# Patient Record
Sex: Female | Born: 1955
Health system: Southern US, Community
[De-identification: ages and names within clinical notes are randomized; demographics above are authoritative.]

## PROBLEM LIST (undated history)

## (undated) DIAGNOSIS — M0230 Reiter's disease, unspecified site: Secondary | ICD-10-CM

## (undated) HISTORY — PX: HUMERUS SURGERY: SHX672

## (undated) HISTORY — DX: Reiter's disease, unspecified site: M02.30

## (undated) HISTORY — PX: FRACTURE SURGERY: SHX138

---

## 1978-07-06 HISTORY — PX: BREAST EXCISIONAL BIOPSY: SUR124

## 2011-03-11 ENCOUNTER — Other Ambulatory Visit: Payer: Self-pay | Admitting: Obstetrics and Gynecology

## 2011-03-11 ENCOUNTER — Other Ambulatory Visit (HOSPITAL_COMMUNITY)
Admission: RE | Admit: 2011-03-11 | Discharge: 2011-03-11 | Disposition: A | Discharge status: Home / Self Care | Payer: BC Managed Care – PPO | Source: Ambulatory Visit | Attending: Obstetrics and Gynecology | Admitting: Obstetrics and Gynecology

## 2011-03-11 DIAGNOSIS — Z01419 Encounter for gynecological examination (general) (routine) without abnormal findings: Secondary | ICD-10-CM | POA: Insufficient documentation

## 2011-03-11 DIAGNOSIS — N6315 Unspecified lump in the right breast, overlapping quadrants: Secondary | ICD-10-CM

## 2011-03-18 ENCOUNTER — Other Ambulatory Visit: Payer: Self-pay

## 2011-03-25 ENCOUNTER — Other Ambulatory Visit: Payer: Self-pay

## 2013-07-11 ENCOUNTER — Other Ambulatory Visit: Payer: Self-pay | Admitting: Obstetrics and Gynecology

## 2013-07-11 DIAGNOSIS — Z1231 Encounter for screening mammogram for malignant neoplasm of breast: Secondary | ICD-10-CM

## 2013-08-02 ENCOUNTER — Ambulatory Visit
Admission: RE | Admit: 2013-08-02 | Discharge: 2013-08-02 | Disposition: A | Discharge status: Home / Self Care | Payer: Self-pay | Source: Ambulatory Visit | Attending: Obstetrics and Gynecology | Admitting: Obstetrics and Gynecology

## 2013-08-02 DIAGNOSIS — Z1231 Encounter for screening mammogram for malignant neoplasm of breast: Secondary | ICD-10-CM

## 2014-09-24 ENCOUNTER — Other Ambulatory Visit: Payer: Self-pay

## 2014-09-24 DIAGNOSIS — Z1231 Encounter for screening mammogram for malignant neoplasm of breast: Secondary | ICD-10-CM

## 2014-09-27 ENCOUNTER — Ambulatory Visit
Admission: RE | Admit: 2014-09-27 | Discharge: 2014-09-27 | Disposition: A | Discharge status: Home / Self Care | Payer: BLUE CROSS/BLUE SHIELD | Source: Ambulatory Visit

## 2014-09-27 DIAGNOSIS — Z1231 Encounter for screening mammogram for malignant neoplasm of breast: Secondary | ICD-10-CM

## 2015-03-19 ENCOUNTER — Other Ambulatory Visit: Payer: Self-pay | Admitting: Rheumatology

## 2015-03-19 ENCOUNTER — Ambulatory Visit
Admission: RE | Admit: 2015-03-19 | Discharge: 2015-03-19 | Disposition: A | Discharge status: Home / Self Care | Payer: BLUE CROSS/BLUE SHIELD | Source: Ambulatory Visit | Attending: Rheumatology | Admitting: Rheumatology

## 2015-03-19 DIAGNOSIS — Z9225 Personal history of immunosupression therapy: Secondary | ICD-10-CM

## 2015-08-26 ENCOUNTER — Other Ambulatory Visit: Payer: Self-pay

## 2015-08-26 DIAGNOSIS — Z1231 Encounter for screening mammogram for malignant neoplasm of breast: Secondary | ICD-10-CM

## 2015-09-30 ENCOUNTER — Ambulatory Visit
Admission: RE | Admit: 2015-09-30 | Discharge: 2015-09-30 | Disposition: A | Discharge status: Home / Self Care | Payer: BLUE CROSS/BLUE SHIELD | Source: Ambulatory Visit

## 2015-09-30 DIAGNOSIS — Z1231 Encounter for screening mammogram for malignant neoplasm of breast: Secondary | ICD-10-CM

## 2016-05-12 ENCOUNTER — Other Ambulatory Visit: Payer: Self-pay | Admitting: Internal Medicine

## 2016-05-12 ENCOUNTER — Ambulatory Visit
Admission: RE | Admit: 2016-05-12 | Discharge: 2016-05-12 | Disposition: A | Discharge status: Home / Self Care | Payer: BLUE CROSS/BLUE SHIELD | Source: Ambulatory Visit | Attending: Internal Medicine | Admitting: Internal Medicine

## 2016-05-12 DIAGNOSIS — R0781 Pleurodynia: Secondary | ICD-10-CM

## 2016-06-08 ENCOUNTER — Other Ambulatory Visit: Payer: Self-pay | Admitting: Obstetrics and Gynecology

## 2016-06-08 ENCOUNTER — Other Ambulatory Visit (HOSPITAL_COMMUNITY)
Admission: RE | Admit: 2016-06-08 | Discharge: 2016-06-08 | Disposition: A | Discharge status: Home / Self Care | Payer: BLUE CROSS/BLUE SHIELD | Source: Ambulatory Visit | Attending: Obstetrics and Gynecology | Admitting: Obstetrics and Gynecology

## 2016-06-08 DIAGNOSIS — Z01419 Encounter for gynecological examination (general) (routine) without abnormal findings: Secondary | ICD-10-CM | POA: Insufficient documentation

## 2016-06-08 DIAGNOSIS — Z1151 Encounter for screening for human papillomavirus (HPV): Secondary | ICD-10-CM | POA: Insufficient documentation

## 2016-06-10 ENCOUNTER — Other Ambulatory Visit: Payer: Self-pay | Admitting: Gastroenterology

## 2016-06-10 LAB — CYTOLOGY - PAP
Diagnosis: NEGATIVE
HPV (WINDOPATH): NOT DETECTED

## 2016-08-13 ENCOUNTER — Other Ambulatory Visit: Payer: Self-pay | Admitting: Nurse Practitioner

## 2016-08-13 DIAGNOSIS — I7300 Raynaud's syndrome without gangrene: Secondary | ICD-10-CM

## 2016-08-25 ENCOUNTER — Ambulatory Visit
Admission: RE | Admit: 2016-08-25 | Discharge: 2016-08-25 | Disposition: A | Discharge status: Home / Self Care | Payer: BLUE CROSS/BLUE SHIELD | Source: Ambulatory Visit | Attending: Nurse Practitioner | Admitting: Nurse Practitioner

## 2016-08-25 DIAGNOSIS — I7300 Raynaud's syndrome without gangrene: Secondary | ICD-10-CM

## 2016-08-31 ENCOUNTER — Encounter (HOSPITAL_COMMUNITY)
Admission: RE | Disposition: A | Discharge status: Home / Self Care | Payer: Self-pay | Source: Ambulatory Visit | Attending: Gastroenterology

## 2016-08-31 ENCOUNTER — Ambulatory Visit (HOSPITAL_COMMUNITY)
Admission: RE | Admit: 2016-08-31 | Discharge: 2016-08-31 | Disposition: A | Discharge status: Home / Self Care | Payer: BLUE CROSS/BLUE SHIELD | Source: Ambulatory Visit | Attending: Gastroenterology | Admitting: Gastroenterology

## 2016-08-31 ENCOUNTER — Ambulatory Visit (HOSPITAL_COMMUNITY): Payer: BLUE CROSS/BLUE SHIELD | Admitting: Anesthesiology

## 2016-08-31 ENCOUNTER — Encounter (HOSPITAL_COMMUNITY): Payer: Self-pay | Admitting: *Deleted

## 2016-08-31 DIAGNOSIS — Z1211 Encounter for screening for malignant neoplasm of colon: Secondary | ICD-10-CM | POA: Insufficient documentation

## 2016-08-31 DIAGNOSIS — H209 Unspecified iridocyclitis: Secondary | ICD-10-CM | POA: Insufficient documentation

## 2016-08-31 DIAGNOSIS — F419 Anxiety disorder, unspecified: Secondary | ICD-10-CM | POA: Insufficient documentation

## 2016-08-31 DIAGNOSIS — F329 Major depressive disorder, single episode, unspecified: Secondary | ICD-10-CM | POA: Diagnosis not present

## 2016-08-31 DIAGNOSIS — I7300 Raynaud's syndrome without gangrene: Secondary | ICD-10-CM | POA: Diagnosis not present

## 2016-08-31 HISTORY — PX: COLONOSCOPY WITH PROPOFOL: SHX5780

## 2016-08-31 SURGERY — COLONOSCOPY WITH PROPOFOL
Anesthesia: Monitor Anesthesia Care

## 2016-08-31 MED ORDER — SODIUM CHLORIDE 0.9 % IV SOLN: INTRAVENOUS | Status: DC

## 2016-08-31 MED ORDER — LACTATED RINGERS IV SOLN
INTRAVENOUS | Status: DC
  Administered 2016-08-31: 08:00:00 via INTRAVENOUS

## 2016-08-31 MED ORDER — PROPOFOL 10 MG/ML IV BOLUS
INTRAVENOUS | Status: AC
Start: 2016-08-31 — End: 2016-08-31
  Filled 2016-08-31: qty 40

## 2016-08-31 MED ORDER — GLYCOPYRROLATE 0.2 MG/ML IJ SOLN
INTRAMUSCULAR | Status: DC | PRN
  Administered 2016-08-31: 0.2 mg via INTRAVENOUS
  Administered 2016-08-31: 0.1 mg via INTRAVENOUS

## 2016-08-31 MED ORDER — PROPOFOL 10 MG/ML IV BOLUS
INTRAVENOUS | Status: DC | PRN
  Administered 2016-08-31 (×3): 20 mg via INTRAVENOUS
  Administered 2016-08-31: 80 mg via INTRAVENOUS
  Administered 2016-08-31 (×5): 20 mg via INTRAVENOUS

## 2016-08-31 MED ORDER — LIDOCAINE HCL 1 % IJ SOLN
INTRAMUSCULAR | Status: DC | PRN
  Administered 2016-08-31: 20 mg via INTRADERMAL

## 2016-08-31 SURGICAL SUPPLY — 21 items

## 2016-08-31 NOTE — Op Note (Signed)
[facility] Patient Name: Alexis Payne Procedure Date: 08/31/2016 MRN: [id number] Attending MD: Garlan Fair , MD Date of Birth: 1956/06/22 CSN: [id number] Age: 61 Admit Type: Outpatient Procedure:                Colonoscopy Indications:              Screening for colorectal malignant neoplasm Providers:                Garlan Fair, MD, Cleda Daub, RN, William Dalton, Technician Referring MD:              Medicines:                Propofol per Anesthesia Complications:            No immediate complications. Estimated Blood Loss:     Estimated blood loss: none. Procedure:                Pre-Anesthesia Assessment:                           - Prior to the procedure, a History and Physical                            was performed, and patient medications and                            allergies were reviewed. The patient's tolerance of                            previous anesthesia was also reviewed. The risks                            and benefits of the procedure and the sedation                            options and risks were discussed with the patient.                            All questions were answered, and informed consent                            was obtained. Prior Anticoagulants: The patient has                            taken ibuprofen, last dose was 1 day prior to                            procedure. ASA Grade Assessment: II - A patient                            with mild systemic disease. After reviewing the  risks and benefits, the patient was deemed in                            satisfactory condition to undergo the procedure.                           After obtaining informed consent, the colonoscope                            was passed under direct vision. Throughout the                            procedure, the patient's blood pressure, pulse, and   oxygen saturations were monitored continuously. The                            [id number]) scope was introduced through                            the anus and advanced to the the cecum, identified                            by appendiceal orifice and ileocecal valve. The                            colonoscopy was performed without difficulty. The                            patient tolerated the procedure well. The quality                            of the bowel preparation was good. The ileocecal                            valve, the appendiceal orifice and the rectum were                            photographed. Findings:      The perianal and digital rectal examinations were normal.      The entire examined colon appeared normal. Impression:               - The entire examined colon is normal.                           - No specimens collected. Moderate Sedation:      N/A- Per Anesthesia Care Recommendation:           - Patient has a contact number available for                            emergencies. The signs and symptoms of potential                            delayed complications were discussed with the  patient. Return to normal activities tomorrow.                            Written discharge instructions were provided to the                            patient.                           - Repeat colonoscopy in 10 years for screening                            purposes.                           - Resume previous diet.                           - Continue present medications. Procedure Code(s):        --- Professional ---                           [id number], Colorectal cancer screening; colonoscopy on                            individual not meeting criteria for high risk Diagnosis Code(s):        --- Professional ---                           Z12.11, Encounter for screening for malignant                            neoplasm of colon CPT  copyright 2016 American Medical Association. All rights reserved. The codes documented in this report are preliminary and upon coder review may  be revised to meet current compliance requirements. Earle Gell, MD Garlan Fair, MD 08/31/2016 9:44:51 AM This report has been signed electronically. Number of Addenda: 0

## 2016-08-31 NOTE — Anesthesia Preprocedure Evaluation (Signed)
Anesthesia Evaluation  Patient identified by MRN, date of birth, ID band Patient awake    Reviewed: Allergy & Precautions, NPO status , Patient's Chart, lab work & pertinent test results  Airway Mallampati: II  TM Distance: >3 FB Neck ROM: Full    Dental no notable dental hx.    Pulmonary neg pulmonary ROS,    Pulmonary exam normal breath sounds clear to auscultation       Cardiovascular negative cardio ROS Normal cardiovascular exam Rhythm:Regular Rate:Normal     Neuro/Psych negative neurological ROS  negative psych ROS   GI/Hepatic negative GI ROS, Neg liver ROS,   Endo/Other  negative endocrine ROS  Renal/GU negative Renal ROS  negative genitourinary   Musculoskeletal negative musculoskeletal ROS (+)   Abdominal   Peds negative pediatric ROS (+)  Hematology negative hematology ROS (+)   Anesthesia Other Findings   Reproductive/Obstetrics negative OB ROS                             Anesthesia Physical Anesthesia Plan  ASA: II  Anesthesia Plan: MAC   Post-op Pain Management:    Induction: Intravenous  Airway Management Planned: Nasal Cannula  Additional Equipment:   Intra-op Plan:   Post-operative Plan:   Informed Consent: I have reviewed the patients History and Physical, chart, labs and discussed the procedure including the risks, benefits and alternatives for the proposed anesthesia with the patient or authorized representative who has indicated his/her understanding and acceptance.   Dental advisory given  Plan Discussed with: CRNA and Surgeon  Anesthesia Plan Comments:         Anesthesia Quick Evaluation  

## 2016-08-31 NOTE — Discharge Instructions (Signed)

## 2016-08-31 NOTE — H&P (Signed)
Procedure: Screening colonoscopy. Baseline screening colonoscopy was performed in [place], [place] 10 years ago  History: The patient is a 61 year old female born Oct 21, 1955. She is scheduled to undergo a repeat screening colonoscopy today.  Medication allergies: Wellbutrin caused seizure  Past medical history: Shoulder surgery. Hip debridement surgery without prosthesis. Generalized anxiety disorder. Spondyloarthropathy. Iritis and uveitis. Raynaud's phenomenon. Depression.  Exam: The patient is alert and lying comfortably on the endoscopy stretcher. Abdomen is soft and nontender to palpation. Cardiac exam reveals a regular rhythm. Lungs are clear to auscultation.  Plan: Proceed with screening colonoscopy

## 2016-08-31 NOTE — Anesthesia Postprocedure Evaluation (Addendum)
Anesthesia Post Note  Patient: Alexis Payne  Procedure(s) Performed: Procedure(s) (LRB): COLONOSCOPY WITH PROPOFOL (N/A)  Patient location during evaluation: PACU Anesthesia Type: MAC Level of consciousness: awake and alert Pain management: pain level controlled Vital Signs Assessment: post-procedure vital signs reviewed and stable Respiratory status: spontaneous breathing, nonlabored ventilation, respiratory function stable and patient connected to nasal cannula oxygen Cardiovascular status: stable and blood pressure returned to baseline Anesthetic complications: no       Last Vitals:  Vitals:   08/31/16 1000 08/31/16 1005  BP: 111/71   Pulse:  79  Resp: 19 18  Temp:      Last Pain:  Vitals:   08/31/16 0946  TempSrc: Oral                 Karam Dunson S

## 2016-08-31 NOTE — Transfer of Care (Signed)
Immediate Anesthesia Transfer of Care Note  Patient: Alexis Payne  Procedure(s) Performed: Procedure(s): COLONOSCOPY WITH PROPOFOL (N/A)  Patient Location: PACU and Endoscopy Unit  Anesthesia Type:MAC  Level of Consciousness: awake, alert  and oriented  Airway & Oxygen Therapy: Patient Spontanous Breathing and Patient connected to nasal cannula oxygen  Post-op Assessment: Report given to RN and Post -op Vital signs reviewed and stable  Post vital signs: Reviewed and stable  Last Vitals:  Vitals:   08/31/16 0811  BP: 95/66  Resp: 12  Temp: 36.7 C    Last Pain:  Vitals:   08/31/16 0811  TempSrc: Oral         Complications: No apparent anesthesia complications

## 2016-09-02 ENCOUNTER — Encounter (HOSPITAL_COMMUNITY): Payer: Self-pay | Admitting: Gastroenterology

## 2016-11-04 ENCOUNTER — Other Ambulatory Visit: Payer: Self-pay | Admitting: Internal Medicine

## 2016-11-04 DIAGNOSIS — Z1231 Encounter for screening mammogram for malignant neoplasm of breast: Secondary | ICD-10-CM

## 2016-11-24 ENCOUNTER — Ambulatory Visit
Admission: RE | Admit: 2016-11-24 | Discharge: 2016-11-24 | Disposition: A | Discharge status: Home / Self Care | Payer: BLUE CROSS/BLUE SHIELD | Source: Ambulatory Visit | Attending: Internal Medicine | Admitting: Internal Medicine

## 2016-11-24 DIAGNOSIS — Z1231 Encounter for screening mammogram for malignant neoplasm of breast: Secondary | ICD-10-CM

## 2016-12-07 NOTE — Addendum Note (Signed)
Addendum  created 12/07/16 1136 by Myrtie Soman, MD   Sign clinical note

## 2016-12-25 ENCOUNTER — Emergency Department (HOSPITAL_COMMUNITY)
Admission: EM | Admit: 2016-12-25 | Discharge: 2016-12-25 | Disposition: A | Discharge status: Home / Self Care | Payer: BLUE CROSS/BLUE SHIELD | Attending: Emergency Medicine | Admitting: Emergency Medicine

## 2016-12-25 ENCOUNTER — Encounter (HOSPITAL_COMMUNITY): Payer: Self-pay | Admitting: Emergency Medicine

## 2016-12-25 DIAGNOSIS — M25512 Pain in left shoulder: Secondary | ICD-10-CM | POA: Insufficient documentation

## 2016-12-25 DIAGNOSIS — R200 Anesthesia of skin: Secondary | ICD-10-CM | POA: Insufficient documentation

## 2016-12-25 DIAGNOSIS — K0889 Other specified disorders of teeth and supporting structures: Secondary | ICD-10-CM | POA: Diagnosis not present

## 2016-12-25 DIAGNOSIS — Z79899 Other long term (current) drug therapy: Secondary | ICD-10-CM | POA: Diagnosis not present

## 2016-12-25 MED ORDER — IBUPROFEN 200 MG PO TABS
400.0000 mg | ORAL_TABLET | Freq: Once | ORAL | Status: AC
  Administered 2016-12-25: 400 mg via ORAL
  Filled 2016-12-25: qty 2

## 2016-12-25 NOTE — Discharge Instructions (Signed)
For pain control please take Ibuprofen (also known as Motrin or Advil) 400mg  (this is normally 2 over the counter pills) every 6 hours. Take with food to minimize stomach irritation.
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
## 2017-04-29 ENCOUNTER — Ambulatory Visit: Payer: Self-pay | Admitting: Podiatry

## 2017-05-19 ENCOUNTER — Ambulatory Visit: Payer: Self-pay | Admitting: Podiatry

## 2017-06-24 DIAGNOSIS — Z0000 Encounter for general adult medical examination without abnormal findings: Secondary | ICD-10-CM | POA: Diagnosis not present

## 2017-06-24 DIAGNOSIS — F418 Other specified anxiety disorders: Secondary | ICD-10-CM | POA: Diagnosis not present

## 2017-06-24 DIAGNOSIS — M47816 Spondylosis without myelopathy or radiculopathy, lumbar region: Secondary | ICD-10-CM | POA: Diagnosis not present

## 2017-06-24 DIAGNOSIS — I7300 Raynaud's syndrome without gangrene: Secondary | ICD-10-CM | POA: Diagnosis not present

## 2017-07-02 ENCOUNTER — Encounter (HOSPITAL_COMMUNITY): Payer: Self-pay | Admitting: Emergency Medicine

## 2017-07-02 ENCOUNTER — Emergency Department (HOSPITAL_COMMUNITY)
Admission: EM | Admit: 2017-07-02 | Discharge: 2017-07-02 | Disposition: A | Discharge status: Home / Self Care | Payer: BLUE CROSS/BLUE SHIELD | Attending: Emergency Medicine | Admitting: Emergency Medicine

## 2017-07-02 ENCOUNTER — Other Ambulatory Visit: Payer: Self-pay

## 2017-07-02 DIAGNOSIS — R55 Syncope and collapse: Secondary | ICD-10-CM | POA: Diagnosis not present

## 2017-07-02 DIAGNOSIS — Z79899 Other long term (current) drug therapy: Secondary | ICD-10-CM | POA: Diagnosis not present

## 2017-07-02 DIAGNOSIS — R404 Transient alteration of awareness: Secondary | ICD-10-CM | POA: Diagnosis not present

## 2017-07-02 DIAGNOSIS — R5383 Other fatigue: Secondary | ICD-10-CM | POA: Insufficient documentation

## 2017-07-02 LAB — BASIC METABOLIC PANEL
ANION GAP: 11 (ref 5–15)
BUN: 14 mg/dL (ref 6–20)
CO2: 26 mmol/L (ref 22–32)
Calcium: 9.5 mg/dL (ref 8.9–10.3)
Chloride: 102 mmol/L (ref 101–111)
Creatinine, Ser: 0.83 mg/dL (ref 0.44–1.00)
GFR calc Af Amer: >60 mL/min (ref >60)
GLUCOSE: 98 mg/dL (ref 65–99)
POTASSIUM: 3.8 mmol/L (ref 3.5–5.1)
Sodium: 139 mmol/L (ref 135–145)

## 2017-07-02 LAB — CBC
HEMATOCRIT: 40.8 % (ref 36.0–46.0)
Hemoglobin: 13.5 g/dL (ref 12.0–15.0)
MCH: 31.5 pg (ref 26.0–34.0)
MCHC: 33.1 g/dL (ref 30.0–36.0)
MCV: 95.3 fL (ref 78.0–100.0)
PLATELETS: 259 10*3/uL (ref 150–400)
RBC: 4.28 MIL/uL (ref 3.87–5.11)
RDW: 12.9 % (ref 11.5–15.5)
WBC: 8 10*3/uL (ref 4.0–10.5)

## 2017-07-02 LAB — I-STAT TROPONIN, ED: Troponin i, poc: 0 ng/mL (ref 0.00–0.08)

## 2017-07-02 MED ORDER — PANTOPRAZOLE SODIUM 20 MG PO TBEC: 20.0000 mg | DELAYED_RELEASE_TABLET | Freq: Every day | ORAL | 0 refills | Status: DC

## 2017-07-02 MED ORDER — SODIUM CHLORIDE 0.9 % IV BOLUS (SEPSIS): 1000.0000 mL | Freq: Once | INTRAVENOUS | Status: DC

## 2017-07-02 MED ORDER — ONDANSETRON 4 MG PO TBDP
4.0000 mg | ORAL_TABLET | Freq: Once | ORAL | Status: AC
  Administered 2017-07-02: 4 mg via ORAL
  Filled 2017-07-02: qty 1

## 2017-07-02 MED ORDER — GI COCKTAIL ~~LOC~~
30.0000 mL | Freq: Once | ORAL | Status: AC
  Administered 2017-07-02: 30 mL via ORAL
  Filled 2017-07-02: qty 30

## 2017-07-02 NOTE — ED Triage Notes (Signed)
Pt was at nail salon after eating lunch and was sitting down when she felt dizzy and passed out. Staff helped her to the bathroom and immediatly stomach started to hurt 10/10 and diarrhea with nausea. 105/59, hr 50 100% ra,

## 2017-07-02 NOTE — ED Provider Notes (Signed)
[facility] EMERGENCY DEPARTMENT Provider Note   CSN: [id number] Arrival date & time: 07/02/17  1658     History   Chief Complaint Chief Complaint  Patient presents with  . Loss of Consciousness    HPI Alexis Payne is a 61 y.o. female.  HPI    61 year old female with no significant past medical history here with syncopal episode.  The patient states she was in her usual state of health until prior to arrival.  She ate a salad at lunch that reportedly had goat cheese, which has upset her stomach in the past.  She was getting her nails done at salon when she began to feel a flushing sensation come over.  She then became very nauseous and felt a aching, gnawing sensation in her abdomen.  She then began to have tunnel vision, ringing in her ears and a sensation that she was going to pass out.  She was laid down and felt mildly improved.  She then went to the bathroom and had a large loose bowel movement.  She now feels fatigued but otherwise is completely back to baseline patient has any preceding or subsequent chest pain or shortness of breath.  Denies any history of cardiac disease.  No personal or family history of coronary disease or sudden cardiac death.  No recent medication changes.  No focal neurological deficits.   History reviewed. No pertinent past medical history.  There are no active problems to display for this patient.   Past Surgical History:  Procedure Laterality Date  . BREAST EXCISIONAL BIOPSY Right 1980   benign  . COLONOSCOPY WITH PROPOFOL N/A 08/31/2016   Procedure: COLONOSCOPY WITH PROPOFOL;  Surgeon: Garlan Fair, MD;  Location: [facility];  Service: Endoscopy;  Laterality: N/A;  . FRACTURE SURGERY     right shoulder    OB History    No data available       Home Medications    Prior to Admission medications   Medication Sig Start Date End Date Taking? Authorizing Provider  amitriptyline (ELAVIL) 10 MG tablet Take 10 mg by  mouth at bedtime. 07/27/16   [provider]  amLODipine (NORVASC) 5 MG tablet Take 2.5 mg by mouth daily.    [provider]  Calcium Carbonate-Vitamin D (CALCIUM-VITAMIN D3 PO) Take 1 tablet by mouth daily after breakfast. 1200 mg-25 mcg    [provider]  escitalopram (LEXAPRO) 20 MG tablet Take 20 mg by mouth at bedtime. 08/06/16   [provider]  GAVILYTE-N WITH FLAVOR PACK 420 g solution Take 4,000 mLs by mouth once. 06/10/16   [provider]  homatropine 5 % ophthalmic solution Place 1 drop into both eyes See admin instructions. Tapering down dose up to 4 times daily for iritis as needed    [provider]  ibuprofen (ADVIL,MOTRIN) 200 MG tablet Take 600-800 mg by mouth every 8 (eight) hours as needed (for arthritis pain/headache.).    [provider]  pantoprazole (PROTONIX) 20 MG tablet Take 1 tablet (20 mg total) by mouth daily for 14 days. 07/02/17 07/16/17  Duffy Bruce, MD  prednisoLONE acetate (PRED FORTE) 1 % ophthalmic suspension Place 1 drop into both eyes See admin instructions. Tapering dose up to 3 times daily for iritis as needed 06/01/16   [provider]    Family History History reviewed. No pertinent family history.  Social History Social History   Tobacco Use  . Smoking status: Never Smoker  . Smokeless tobacco:  Never Used  Substance Use Topics  . Alcohol use: Yes    Alcohol/week: 1.2 oz    Types: 2 Glasses of wine per week  . Drug use: No     Allergies   Wellbutrin [bupropion]   Review of Systems Review of Systems  Constitutional: Positive for fatigue. Negative for chills and fever.  HENT: Negative for congestion, rhinorrhea and sore throat.   Eyes: Negative for visual disturbance.  Respiratory: Negative for cough, shortness of breath and wheezing.   Cardiovascular: Negative for chest pain and leg swelling.  Gastrointestinal: Negative for abdominal pain, diarrhea, nausea and  vomiting.  Genitourinary: Negative for dysuria, flank pain, vaginal bleeding and vaginal discharge.  Musculoskeletal: Negative for neck pain.  Skin: Negative for rash.  Allergic/Immunologic: Negative for immunocompromised state.  Neurological: Positive for syncope. Negative for headaches.  Hematological: Does not bruise/bleed easily.  All other systems reviewed and are negative.    Physical Exam Updated Vital Signs BP 95/72   Pulse (!) 56   Resp 17   Ht 5\' 8"  (1.727 m)   Wt 60.8 kg (134 lb)   SpO2 98%   BMI 20.37 kg/m   Physical Exam  Constitutional: She is oriented to person, place, and time. She appears well-developed and well-nourished. No distress.  HENT:  Head: Normocephalic and atraumatic.  Eyes: Conjunctivae are normal.  Neck: Neck supple.  Cardiovascular: Normal rate, regular rhythm and normal heart sounds. Exam reveals no friction rub.  No murmur heard. No murmurs appreciated  Pulmonary/Chest: Effort normal and breath sounds normal. No respiratory distress. She has no wheezes. She has no rales.  Abdominal: She exhibits no distension.  Musculoskeletal: She exhibits no edema.  Neurological: She is alert and oriented to person, place, and time. She exhibits normal muscle tone.  Skin: Skin is warm. Capillary refill takes less than 2 seconds.  Psychiatric: She has a normal mood and affect.  Nursing note and vitals reviewed.    ED Treatments / Results  Labs (all labs ordered are listed, but only abnormal results are displayed) Labs Reviewed  CBC  BASIC METABOLIC PANEL  I-STAT TROPONIN, ED    EKG  EKG Interpretation  Date/Time:  Friday July 02 2017 17:45:59 EST Ventricular Rate:  56 PR Interval:    QRS Duration: 82 QT Interval:  423 QTC Calculation: 409 R Axis:   111 Text Interpretation:  Sinus rhythm Right axis deviation No old tracing to compare Confirmed by Duffy Bruce [phone]) on 07/02/2017 5:48:43 PM       Radiology No results  found.  Procedures Procedures (including critical care time)  Medications Ordered in ED Medications  gi cocktail (Maalox,Lidocaine,Donnatal) (30 mLs Oral Given 07/02/17 1816)  ondansetron (ZOFRAN-ODT) disintegrating tablet 4 mg (4 mg Oral Given 07/02/17 1816)     Initial Impression / Assessment and Plan / ED Course  I have reviewed the triage vital signs and the nursing notes.  Pertinent labs & imaging results that were available during my care of the patient were reviewed by me and considered in my medical decision making (see chart for details).     61 year old female here with syncopal episode followed by large loose bowel movement.  Patient's history is highly consistent with likely vasovagal syncope in the setting of mild GI upset.  The patient reportedly has a history of recurrent syncope episodes in similar settings.  She had a classic prodrome.  She has no high risk features.  She is not anemic.  Her elect lites are normal.  EKG shows normal intervals with no signs of arrhythmia.  She is in normal sinus rhythm and sinus bradycardia on telemetry without ectopy.  No personal or family history of sudden cardiac death.  She has no shortness of breath, tachypnea, or evidence to suggest PE.  No focal neurological deficits to suggest stroke.  Had a long discussion with the patient and her husband regarding suspected symptoms.  She does report fairly frequent GI upset.  She has a negative troponin and nonischemic EKG and I do not suspect suspect this is referred pain from cardiac source.  Will trial on a PPI and refer to GI.  Otherwise, advised supportive care and good return precautions.   This note was prepared with assistance of Systems analyst. Occasional wrong-word or sound-a-like substitutions may have occurred due to the inherent limitations of voice recognition software.  Final Clinical Impressions(s) / ED Diagnoses   Final diagnoses:  Vasovagal syncope    ED  Discharge Orders        Ordered    pantoprazole (PROTONIX) 20 MG tablet  Daily     07/02/17 2004       Duffy Bruce, MD 07/03/17 0130

## 2017-07-02 NOTE — Discharge Instructions (Signed)
Call your doctor to discuss possible outpatient Holter/cardiac monitoring.  I have prescribed an antacid to trial to see if it helps with your symptoms.

## 2017-07-12 DIAGNOSIS — M9900 Segmental and somatic dysfunction of head region: Secondary | ICD-10-CM | POA: Diagnosis not present

## 2017-07-12 DIAGNOSIS — M50322 Other cervical disc degeneration at C5-C6 level: Secondary | ICD-10-CM | POA: Diagnosis not present

## 2017-07-12 DIAGNOSIS — M9901 Segmental and somatic dysfunction of cervical region: Secondary | ICD-10-CM | POA: Diagnosis not present

## 2017-07-12 DIAGNOSIS — M4003 Postural kyphosis, cervicothoracic region: Secondary | ICD-10-CM | POA: Diagnosis not present

## 2017-07-26 DIAGNOSIS — M9901 Segmental and somatic dysfunction of cervical region: Secondary | ICD-10-CM | POA: Diagnosis not present

## 2017-07-26 DIAGNOSIS — M5384 Other specified dorsopathies, thoracic region: Secondary | ICD-10-CM | POA: Diagnosis not present

## 2017-07-26 DIAGNOSIS — M9902 Segmental and somatic dysfunction of thoracic region: Secondary | ICD-10-CM | POA: Diagnosis not present

## 2017-07-26 DIAGNOSIS — M5033 Other cervical disc degeneration, cervicothoracic region: Secondary | ICD-10-CM | POA: Diagnosis not present

## 2017-07-27 DIAGNOSIS — M9902 Segmental and somatic dysfunction of thoracic region: Secondary | ICD-10-CM | POA: Diagnosis not present

## 2017-07-27 DIAGNOSIS — M859 Disorder of bone density and structure, unspecified: Secondary | ICD-10-CM | POA: Diagnosis not present

## 2017-07-27 DIAGNOSIS — Z01411 Encounter for gynecological examination (general) (routine) with abnormal findings: Secondary | ICD-10-CM | POA: Diagnosis not present

## 2017-07-27 DIAGNOSIS — M5384 Other specified dorsopathies, thoracic region: Secondary | ICD-10-CM | POA: Diagnosis not present

## 2017-07-27 DIAGNOSIS — M9901 Segmental and somatic dysfunction of cervical region: Secondary | ICD-10-CM | POA: Diagnosis not present

## 2017-07-27 DIAGNOSIS — M5033 Other cervical disc degeneration, cervicothoracic region: Secondary | ICD-10-CM | POA: Diagnosis not present

## 2017-07-29 DIAGNOSIS — M9902 Segmental and somatic dysfunction of thoracic region: Secondary | ICD-10-CM | POA: Diagnosis not present

## 2017-07-29 DIAGNOSIS — M5033 Other cervical disc degeneration, cervicothoracic region: Secondary | ICD-10-CM | POA: Diagnosis not present

## 2017-07-29 DIAGNOSIS — M5384 Other specified dorsopathies, thoracic region: Secondary | ICD-10-CM | POA: Diagnosis not present

## 2017-07-29 DIAGNOSIS — M9901 Segmental and somatic dysfunction of cervical region: Secondary | ICD-10-CM | POA: Diagnosis not present

## 2017-08-03 DIAGNOSIS — M9902 Segmental and somatic dysfunction of thoracic region: Secondary | ICD-10-CM | POA: Diagnosis not present

## 2017-08-03 DIAGNOSIS — M5033 Other cervical disc degeneration, cervicothoracic region: Secondary | ICD-10-CM | POA: Diagnosis not present

## 2017-08-03 DIAGNOSIS — M5384 Other specified dorsopathies, thoracic region: Secondary | ICD-10-CM | POA: Diagnosis not present

## 2017-08-03 DIAGNOSIS — M9901 Segmental and somatic dysfunction of cervical region: Secondary | ICD-10-CM | POA: Diagnosis not present

## 2017-08-05 DIAGNOSIS — M9901 Segmental and somatic dysfunction of cervical region: Secondary | ICD-10-CM | POA: Diagnosis not present

## 2017-08-05 DIAGNOSIS — M5384 Other specified dorsopathies, thoracic region: Secondary | ICD-10-CM | POA: Diagnosis not present

## 2017-08-05 DIAGNOSIS — M5033 Other cervical disc degeneration, cervicothoracic region: Secondary | ICD-10-CM | POA: Diagnosis not present

## 2017-08-05 DIAGNOSIS — M9902 Segmental and somatic dysfunction of thoracic region: Secondary | ICD-10-CM | POA: Diagnosis not present

## 2017-08-09 DIAGNOSIS — M9901 Segmental and somatic dysfunction of cervical region: Secondary | ICD-10-CM | POA: Diagnosis not present

## 2017-08-09 DIAGNOSIS — M5033 Other cervical disc degeneration, cervicothoracic region: Secondary | ICD-10-CM | POA: Diagnosis not present

## 2017-08-09 DIAGNOSIS — M9902 Segmental and somatic dysfunction of thoracic region: Secondary | ICD-10-CM | POA: Diagnosis not present

## 2017-08-09 DIAGNOSIS — M5384 Other specified dorsopathies, thoracic region: Secondary | ICD-10-CM | POA: Diagnosis not present

## 2017-08-10 DIAGNOSIS — M9901 Segmental and somatic dysfunction of cervical region: Secondary | ICD-10-CM | POA: Diagnosis not present

## 2017-08-10 DIAGNOSIS — M9902 Segmental and somatic dysfunction of thoracic region: Secondary | ICD-10-CM | POA: Diagnosis not present

## 2017-08-10 DIAGNOSIS — M5033 Other cervical disc degeneration, cervicothoracic region: Secondary | ICD-10-CM | POA: Diagnosis not present

## 2017-08-10 DIAGNOSIS — M5384 Other specified dorsopathies, thoracic region: Secondary | ICD-10-CM | POA: Diagnosis not present

## 2017-08-17 DIAGNOSIS — M9901 Segmental and somatic dysfunction of cervical region: Secondary | ICD-10-CM | POA: Diagnosis not present

## 2017-08-17 DIAGNOSIS — M5384 Other specified dorsopathies, thoracic region: Secondary | ICD-10-CM | POA: Diagnosis not present

## 2017-08-17 DIAGNOSIS — M9902 Segmental and somatic dysfunction of thoracic region: Secondary | ICD-10-CM | POA: Diagnosis not present

## 2017-08-17 DIAGNOSIS — M5033 Other cervical disc degeneration, cervicothoracic region: Secondary | ICD-10-CM | POA: Diagnosis not present

## 2017-08-18 DIAGNOSIS — M9901 Segmental and somatic dysfunction of cervical region: Secondary | ICD-10-CM | POA: Diagnosis not present

## 2017-08-18 DIAGNOSIS — M5033 Other cervical disc degeneration, cervicothoracic region: Secondary | ICD-10-CM | POA: Diagnosis not present

## 2017-08-18 DIAGNOSIS — M5384 Other specified dorsopathies, thoracic region: Secondary | ICD-10-CM | POA: Diagnosis not present

## 2017-08-18 DIAGNOSIS — M9902 Segmental and somatic dysfunction of thoracic region: Secondary | ICD-10-CM | POA: Diagnosis not present

## 2017-08-20 DIAGNOSIS — M9902 Segmental and somatic dysfunction of thoracic region: Secondary | ICD-10-CM | POA: Diagnosis not present

## 2017-08-20 DIAGNOSIS — M5033 Other cervical disc degeneration, cervicothoracic region: Secondary | ICD-10-CM | POA: Diagnosis not present

## 2017-08-20 DIAGNOSIS — M9901 Segmental and somatic dysfunction of cervical region: Secondary | ICD-10-CM | POA: Diagnosis not present

## 2017-08-20 DIAGNOSIS — M5384 Other specified dorsopathies, thoracic region: Secondary | ICD-10-CM | POA: Diagnosis not present

## 2017-10-12 DIAGNOSIS — H20021 Recurrent acute iridocyclitis, right eye: Secondary | ICD-10-CM | POA: Diagnosis not present

## 2017-10-21 DIAGNOSIS — H20021 Recurrent acute iridocyclitis, right eye: Secondary | ICD-10-CM | POA: Diagnosis not present

## 2017-11-01 ENCOUNTER — Other Ambulatory Visit: Payer: Self-pay | Admitting: Internal Medicine

## 2017-11-01 DIAGNOSIS — Z1231 Encounter for screening mammogram for malignant neoplasm of breast: Secondary | ICD-10-CM

## 2017-11-03 DIAGNOSIS — H20021 Recurrent acute iridocyclitis, right eye: Secondary | ICD-10-CM | POA: Diagnosis not present

## 2017-11-10 DIAGNOSIS — H2011 Chronic iridocyclitis, right eye: Secondary | ICD-10-CM | POA: Diagnosis not present

## 2017-11-12 NOTE — Progress Notes (Addendum)
Office Visit Note  Patient: Alexis Payne             Date of Birth: April 21, 1956           MRN: [id number]             PCP: Leeroy Cha, MD Referring: Leeroy Cha,* Visit Date: 11/15/2017 Occupation: @GUAROCC @    Subjective:  Recurrent iritis and joint pain.   History of Present Illness: Alexis Payne is a 62 y.o. female with history of inflammatory arthritis and recurrent iritis.  She was initially evaluated by me in August 2016.  At that time she was positive for HLA-B27 and anti-CCP.  Her initial symptoms are started in through 2008 with diarrhea and eye inflammation.  At the time she was living in [place].  She was treated with some topical eyedrops.  She was under care of her rheumatologist who placed her on methotrexate.  She took methotrexate until 2012 and then moved to [place].  She had no treatment in [place].  After she was evaluated by [place] we applied for Rasuvo subcu which was not covered by her insurance then she was given a prescription for subcu methotrexate while but she did not restart the medication.  According to patient since then she did not have much joint pain.  She did not have any more episodes of iritis until recently.  Iritis recurred about 3 weeks ago and she has been having recurrent problems with that.  She has been treated with eyedrops only.  Intraocular injection was also discussed with patient.  She has been experiencing increased fatigue.  She states last week she was having pain in her knees and ankles which is better now.  She also feels some discomfort in her right trochanter.  She has explosive diarrhea off and on.  Is approximately once a month.  Nuys any blood in her stool.  Activities of Daily Living:  Patient reports morning stiffness for 0 none.   Patient Denies nocturnal pain.  Difficulty dressing/grooming: Denies Difficulty climbing stairs: Denies Difficulty getting out of chair: Denies Difficulty using hands for  taps, buttons, cutlery, and/or writing: Denies   Review of Systems  Constitutional: Positive for fatigue. Negative for night sweats, weight gain and weight loss.  HENT: Positive for ear ringing. Negative for mouth sores, trouble swallowing, trouble swallowing, mouth dryness and nose dryness.   Eyes: Positive for pain. Negative for redness, visual disturbance and dryness.  Respiratory: Negative for cough, shortness of breath and difficulty breathing.   Cardiovascular: Negative for chest pain, palpitations, hypertension, irregular heartbeat and swelling in legs/feet.  Gastrointestinal: Positive for diarrhea. Negative for blood in stool and constipation.  Endocrine: Positive for cold intolerance. Negative for increased urination.  Genitourinary: Negative for difficulty urinating and vaginal dryness.  Musculoskeletal: Positive for arthralgias and joint pain. Negative for joint swelling, myalgias, muscle weakness, morning stiffness, muscle tenderness and myalgias.  Skin: Negative for color change, rash, hair loss, skin tightness, ulcers and sensitivity to sunlight.  Allergic/Immunologic: Negative for susceptible to infections.  Neurological: Negative for dizziness, numbness, memory loss, night sweats and weakness.  Hematological: Negative for bruising/bleeding tendency and swollen glands.  Psychiatric/Behavioral: Negative for depressed mood and sleep disturbance. The patient is not nervous/anxious.     PMFS History:  There are no active problems to display for this patient.   Past Medical History:  Diagnosis Date  . Reactive arthritis (HCC)     Family History  Problem Relation Age of Onset  . Pulmonary fibrosis  Mother   . Heart disease Mother    Past Surgical History:  Procedure Laterality Date  . BREAST EXCISIONAL BIOPSY Right 1980   benign  . COLONOSCOPY WITH PROPOFOL N/A 08/31/2016   Procedure: COLONOSCOPY WITH PROPOFOL;  Surgeon: Garlan Fair, MD;  Location: [facility];   Service: Endoscopy;  Laterality: N/A;  . FRACTURE SURGERY     right shoulder  . HUMERUS SURGERY     Social History   Social History Narrative  . Not on file     Objective: Vital Signs: BP 98/63 (BP Location: Left Arm, Patient Position: Sitting, Cuff Size: Normal)   Pulse (!) 58   Resp 14   Ht 5' 7.5" (1.715 m)   Wt 131 lb (59.4 kg)   BMI 20.21 kg/m    Physical Exam  Constitutional: She is oriented to person, place, and time. She appears well-developed and well-nourished.  HENT:  Head: Normocephalic and atraumatic.  Eyes: Conjunctivae and EOM are normal.  Mild conjunctival injection was noted in bilateral eyes.  Neck: Normal range of motion.  Cardiovascular: Normal rate, regular rhythm, normal heart sounds and intact distal pulses.  Pulmonary/Chest: Effort normal and breath sounds normal.  Abdominal: Soft. Bowel sounds are normal.  Lymphadenopathy:    She has no cervical adenopathy.  Neurological: She is alert and oriented to person, place, and time.  Skin: Skin is warm and dry. Capillary refill takes less than 2 seconds.  Psychiatric: She has a normal mood and affect. Her behavior is normal.  Nursing note and vitals reviewed.    Musculoskeletal Exam: C-spine thoracic lumbar spine good range of motion.  Shoulder joints elbow joints wrist joint MCPs PIPs DIPs were in good range of motion.  Hip joints knee joints ankles MTPs PIPs DIPs been good range of motion with no synovitis.  CDAI Exam: No CDAI exam completed.    Investigation: No additional findings.   Imaging: No results found.  Speciality Comments: No specialty comments available. All previous records were reviewed from 2016. Patient had a chest x-ray in September 2016 which was unremarkable.  Procedures:  No procedures performed Allergies: Wellbutrin [bupropion]   Assessment / Plan:     Visit Diagnoses: Reactive arthritis ([place]) - +CCP, +HLA B27.  Patient reports she has been having increased  arthralgias recently although she did not have any synovitis on examination.  She was treated for reactive arthritis in 2008 at the time she had knee joint aspiration.  She also gives history of frequent diarrhea.  She states she has been seen by gastroenterologist and had negative colonoscopies.  Recurrent iritis, unspecified laterality -she has had recurrent bilateral iritis for which she has been seen by Dr. Sabra Heck.  She states her iritis was in remission since 2016 but now she is having frequent flares which are not responding to topical steroids.  She is also planning to get intraocular steroid injection.  We had detailed discussion regarding different treatment options and their side effects.  We had discussed subcu methotrexate in the past in 2016.  At that time patient did not go on the medication.  I have suggested following labs prior to starting her on methotrexate.  Patient states that she easily passes out and will come back tomorrow after she is well-hydrated to get her labs.  The plan is to start her on subcu methotrexate after we have lab results available.  Plan: Urinalysis, Routine w reflex microscopic, ANA, Rheumatoid factor, Angiotensin converting enzyme, Pan-ANCA, Cyclic citrul peptide antibody, IgG, Hepatitis  B core antibody, IgM, Hepatitis B surface antigen, Hepatitis C antibody, QuantiFERON-TB Gold Plus, HIV antibody, Fluorescent treponemal ab(fta)-IgG-bld  Raynaud's disease without gangrene-not currently active.  High risk medication use - Plan: CBC with Differential/Platelet, COMPLETE METABOLIC PANEL WITH GFR  History of depression    Orders: Orders Placed This Encounter  Procedures  . CBC with Differential/Platelet  . COMPLETE METABOLIC PANEL WITH GFR  . Urinalysis, Routine w reflex microscopic  . ANA  . Rheumatoid factor  . Angiotensin converting enzyme  . Pan-ANCA  . Cyclic citrul peptide antibody, IgG  . Hepatitis B core antibody, IgM  . Hepatitis B surface  antigen  . Hepatitis C antibody  . QuantiFERON-TB Gold Plus  . HIV antibody  . Fluorescent treponemal ab(fta)-IgG-bld   No orders of the defined types were placed in this encounter.   Face-to-face time spent with patient was 40 minutes. >50% of time was spent in counseling and coordination of care.  Follow-Up Instructions: Return in about 1 month (around 12/13/2017) for Iritis, inflammatory arthritis.   Bo Merino, MD  Note - This record has been created using Editor, commissioning.  Chart creation errors have been sought, but may not always  have been located. Such creation errors do not reflect on  the standard of medical care.

## 2017-11-15 ENCOUNTER — Encounter: Payer: Self-pay | Admitting: Rheumatology

## 2017-11-15 ENCOUNTER — Ambulatory Visit (INDEPENDENT_AMBULATORY_CARE_PROVIDER_SITE_OTHER): Payer: BLUE CROSS/BLUE SHIELD | Admitting: Rheumatology

## 2017-11-15 ENCOUNTER — Telehealth: Payer: Self-pay

## 2017-11-15 VITALS — BP 98/63 | HR 58 | Resp 14 | Ht 67.5 in | Wt 131.0 lb

## 2017-11-15 DIAGNOSIS — I7300 Raynaud's syndrome without gangrene: Secondary | ICD-10-CM

## 2017-11-15 DIAGNOSIS — H20029 Recurrent acute iridocyclitis, unspecified eye: Secondary | ICD-10-CM

## 2017-11-15 DIAGNOSIS — Z79899 Other long term (current) drug therapy: Secondary | ICD-10-CM

## 2017-11-15 DIAGNOSIS — Z8659 Personal history of other mental and behavioral disorders: Secondary | ICD-10-CM

## 2017-11-15 DIAGNOSIS — M0230 Reiter's disease, unspecified site: Secondary | ICD-10-CM | POA: Diagnosis not present

## 2017-11-15 NOTE — Telephone Encounter (Signed)
Was asked to submit a prior authorization for Otrexup 20mg  to pts insurance. Authorization was submitted. Plans preferred MTX injectable is Rasuvo.   Okay to change to Rasuvo 20mg s? Thanks!  Ryanna Teschner, [place], CPhT 12:40 PM

## 2017-11-15 NOTE — Telephone Encounter (Signed)
Received a fax from [employer] with authorization form for Rasuvo. Authorization was completed and faxed back. Will update once we have a response.   Will send document to scan center.  Danette Weinfeld, [place], CPhT 2:18 PM

## 2017-11-15 NOTE — Telephone Encounter (Signed)
ok 

## 2017-11-16 DIAGNOSIS — H20029 Recurrent acute iridocyclitis, unspecified eye: Secondary | ICD-10-CM | POA: Diagnosis not present

## 2017-11-16 DIAGNOSIS — Z79899 Other long term (current) drug therapy: Secondary | ICD-10-CM | POA: Diagnosis not present

## 2017-11-16 NOTE — Telephone Encounter (Signed)
Received a fax from [place] regarding a prior authorization [place] for [place]. Medication is only approved when pt has a diagnosis of Rheumatoid arthritis, polyarticular juvenile idiopathic arthritis or psoriasis. Pt has a diagnosis of Reactive arthritis/Iritis.   Reference [phone] Phone number:  Will send document to scan center.  Called pt to update. Insurance will cover the vial an syringe MTX. Left message for pt to call back.   Bobie Caris, [place], CPhT  10:12 AM

## 2017-11-17 DIAGNOSIS — H40051 Ocular hypertension, right eye: Secondary | ICD-10-CM | POA: Diagnosis not present

## 2017-11-17 DIAGNOSIS — H20021 Recurrent acute iridocyclitis, right eye: Secondary | ICD-10-CM | POA: Diagnosis not present

## 2017-11-18 DIAGNOSIS — H20021 Recurrent acute iridocyclitis, right eye: Secondary | ICD-10-CM | POA: Diagnosis not present

## 2017-11-18 LAB — URINALYSIS, ROUTINE W REFLEX MICROSCOPIC
Bilirubin Urine: NEGATIVE
GLUCOSE, UA: NEGATIVE
HGB URINE DIPSTICK: NEGATIVE
Ketones, ur: NEGATIVE
LEUKOCYTES UA: NEGATIVE
Nitrite: NEGATIVE
Protein, ur: NEGATIVE
Specific Gravity, Urine: 1.009 (ref 1.001–1.03)
pH: 5.5 (ref 5.0–8.0)

## 2017-11-18 LAB — HEPATITIS B SURFACE ANTIGEN: HEP B S AG: NONREACTIVE

## 2017-11-18 LAB — CBC WITH DIFFERENTIAL/PLATELET
BASOS PCT: 0.7 %
Basophils Absolute: 40 cells/uL (ref 0–200)
EOS PCT: 1.2 %
Eosinophils Absolute: 68 cells/uL (ref 15–500)
HEMATOCRIT: 37.1 % (ref 35.0–45.0)
HEMOGLOBIN: 12.9 g/dL (ref 11.7–15.5)
LYMPHS ABS: 2052 {cells}/uL (ref 850–3900)
MCH: 31.5 pg (ref 27.0–33.0)
MCHC: 34.8 g/dL (ref 32.0–36.0)
MCV: 90.7 fL (ref 80.0–100.0)
MONOS PCT: 5.6 %
MPV: 9.5 fL (ref 7.5–12.5)
NEUTROS ABS: 3221 {cells}/uL (ref 1500–7800)
Neutrophils Relative %: 56.5 %
Platelets: 294 10*3/uL (ref 140–400)
RBC: 4.09 10*6/uL (ref 3.80–5.10)
RDW: 12.1 % (ref 11.0–15.0)
Total Lymphocyte: 36 %
WBC mixed population: 319 cells/uL (ref 200–950)
WBC: 5.7 10*3/uL (ref 3.8–10.8)

## 2017-11-18 LAB — PAN-ANCA
ANCA SCREEN: NEGATIVE
Myeloperoxidase Abs: <1 AI
Serine Protease 3: <1 AI

## 2017-11-18 LAB — COMPLETE METABOLIC PANEL WITH GFR
AG Ratio: 1.7 (calc) (ref 1.0–2.5)
ALBUMIN MSPROF: 4.8 g/dL (ref 3.6–5.1)
ALT: 11 U/L (ref 6–29)
AST: 16 U/L (ref 10–35)
Alkaline phosphatase (APISO): 75 U/L (ref 33–130)
BUN: 14 mg/dL (ref 7–25)
CALCIUM: 9.9 mg/dL (ref 8.6–10.4)
CO2: 29 mmol/L (ref 20–32)
CREATININE: 0.79 mg/dL (ref 0.50–0.99)
Chloride: 99 mmol/L (ref 98–110)
GFR, EST NON AFRICAN AMERICAN: 80 mL/min/{1.73_m2} (ref >=60)
GFR, Est African American: 93 mL/min/{1.73_m2} (ref >=60)
GLOBULIN: 2.9 g/dL (ref 1.9–3.7)
GLUCOSE: 88 mg/dL (ref 65–99)
Potassium: 3.8 mmol/L (ref 3.5–5.3)
SODIUM: 136 mmol/L (ref 135–146)
TOTAL PROTEIN: 7.7 g/dL (ref 6.1–8.1)
Total Bilirubin: 0.4 mg/dL (ref 0.2–1.2)

## 2017-11-18 LAB — ANTI-NUCLEAR AB-TITER (ANA TITER)

## 2017-11-18 LAB — QUANTIFERON-TB GOLD PLUS
Mitogen-NIL: >10 IU/mL
NIL: 0.06 IU/mL
QuantiFERON-TB Gold Plus: NEGATIVE
TB2-NIL: <0 IU/mL

## 2017-11-18 LAB — HEPATITIS B CORE ANTIBODY, IGM: HEP B C IGM: NONREACTIVE

## 2017-11-18 LAB — HIV ANTIBODY (ROUTINE TESTING W REFLEX): HIV: NONREACTIVE

## 2017-11-18 LAB — ANGIOTENSIN CONVERTING ENZYME: Angiotensin-Converting Enzyme: 54 U/L (ref 9–67)

## 2017-11-18 LAB — FLUORESCENT TREPONEMAL AB(FTA)-IGG-BLD: Fluorescent Treponemal ABS: NONREACTIVE

## 2017-11-18 LAB — RHEUMATOID FACTOR: Rhuematoid fact SerPl-aCnc: <14 IU/mL (ref <14)

## 2017-11-18 LAB — ANA: ANA: POSITIVE — AB

## 2017-11-18 LAB — CYCLIC CITRUL PEPTIDE ANTIBODY, IGG: Cyclic Citrullin Peptide Ab: <16 UNITS

## 2017-11-18 LAB — HEPATITIS C ANTIBODY
HEP C AB: NONREACTIVE
SIGNAL TO CUT-OFF: 0.01 (ref <1.00)

## 2017-11-22 NOTE — Progress Notes (Signed)
Will discuss at the fu visit

## 2017-11-30 ENCOUNTER — Ambulatory Visit
Admission: RE | Admit: 2017-11-30 | Discharge: 2017-11-30 | Disposition: A | Discharge status: Home / Self Care | Payer: BLUE CROSS/BLUE SHIELD | Source: Ambulatory Visit | Attending: Internal Medicine | Admitting: Internal Medicine

## 2017-11-30 DIAGNOSIS — Z1231 Encounter for screening mammogram for malignant neoplasm of breast: Secondary | ICD-10-CM | POA: Diagnosis not present

## 2017-12-02 NOTE — Progress Notes (Signed)
Office Visit Note  Patient: Alexis Payne             Date of Birth: [birth year]           MRN: [id number]             PCP: Leeroy Cha, MD Referring: Leeroy Cha,* Visit Date: 12/14/2017 Occupation: @GUAROCC @    Subjective:  Discuss medication options   History of Present Illness: [name] is a 62 y.o. female with history of reactive arthritis.  She reports some intermittent discomfort in her left ankle.  She is also been having some tenderness on the lateral aspect of the fifth toe bilaterally.  She denies any other joint pain or joint swelling at this time.  She has not had any recurrent knee effusions.  She states that about 2 weeks ago and iritis flare resolved after 1 month.  She continues to see Dr. Sabra Heck on a regular basis.  She has been started on a new eyedrop which has been helping her symptoms.  She denies starting on intraocular steroid injections.   Activities of Daily Living:  Patient reports morning stiffness for 0 minutes.   Patient Denies nocturnal pain.  Difficulty dressing/grooming: Denies Difficulty climbing stairs: Denies Difficulty getting out of chair: Denies Difficulty using hands for taps, buttons, cutlery, and/or writing: Denies   Review of Systems  Constitutional: Negative for fatigue and fever.  HENT: Positive for ear ringing. Negative for ear pain.   Eyes: Negative for pain.  Respiratory: Negative for cough and shortness of breath.   Cardiovascular: Positive for swelling in legs/feet.  Gastrointestinal: Negative for constipation and diarrhea.  Genitourinary: Negative for difficulty urinating.  Musculoskeletal: Negative for arthralgias, joint pain, joint swelling, myalgias, muscle weakness, morning stiffness and myalgias.  Skin: Negative for rash, hair loss and sensitivity to sunlight.  Neurological: Positive for weakness. Negative for numbness.  Hematological: Negative for bruising/bleeding tendency.    Psychiatric/Behavioral: Negative for depressed mood and sleep disturbance.    PMFS History:  There are no active problems to display for this patient.   Past Medical History:  Diagnosis Date  . Reactive arthritis (HCC)     Family History  Problem Relation Age of Onset  . Pulmonary fibrosis Mother   . Heart disease Mother    Past Surgical History:  Procedure Laterality Date  . BREAST EXCISIONAL BIOPSY Right 1980   benign  . COLONOSCOPY WITH PROPOFOL N/A 08/31/2016   Procedure: COLONOSCOPY WITH PROPOFOL;  Surgeon: Garlan Fair, MD;  Location: [facility];  Service: Endoscopy;  Laterality: N/A;  . FRACTURE SURGERY     right shoulder  . HUMERUS SURGERY     Social History   Social History Narrative  . Not on file     Objective: Vital Signs: BP 106/67 (BP Location: Left Arm, Patient Position: Sitting, Cuff Size: Normal)   Pulse (!) 59   Ht 5' 7.5" (1.715 m)   Wt 132 lb (59.9 kg)   BMI 20.37 kg/m    Physical Exam  Constitutional: She is oriented to person, place, and time. She appears well-developed and well-nourished.  HENT:  Head: Normocephalic and atraumatic.  Eyes: Conjunctivae and EOM are normal.  Neck: Normal range of motion.  Cardiovascular: Normal rate, regular rhythm, normal heart sounds and intact distal pulses.  Pulmonary/Chest: Effort normal and breath sounds normal.  Abdominal: Soft. Bowel sounds are normal.  Lymphadenopathy:    She has no cervical adenopathy.  Neurological: She is alert and oriented to  person, place, and time.  Skin: Skin is warm and dry. Capillary refill takes less than 2 seconds.  Psychiatric: She has a normal mood and affect. Her behavior is normal.  Nursing note and vitals reviewed.    Musculoskeletal Exam: C-spine, thoracic spine, lumbar spine good range of motion.  No midline spinal tenderness.  No SI joint tenderness.  Shoulder joints, elbow joints, wrist joints, MCPs, PIPs, DIPs good range of motion with no synovitis.  Hip  joints, knee joints, ankle joints, MTPs, PIPs, DIPs good range of motion with no synovitis.  No tenderness of trochanteric bursa bilaterally.  No warmth or effusion of bilateral knees.  She is no tenderness on the ankle joints bilaterally.  She has PIP and DIP synovial thickening consistent with zoster arthritis of bilateral feet.  CDAI Exam: No CDAI exam completed.    Investigation: No additional findings. CBC Latest Ref Rng & Units 11/16/2017 07/02/2017  WBC 3.8 - 10.8 Thousand/uL 5.7 8.0  Hemoglobin 11.7 - 15.5 g/dL 12.9 13.5  Hematocrit 35.0 - 45.0 % 37.1 40.8  Platelets 140 - 400 Thousand/uL 294 259   CMP Latest Ref Rng & Units 11/16/2017 07/02/2017  Glucose 65 - 99 mg/dL 88 98  BUN 7 - 25 mg/dL 14 14  Creatinine 0.50 - 0.99 mg/dL 0.79 0.83  Sodium 135 - 146 mmol/L 136 139  Potassium 3.5 - 5.3 mmol/L 3.8 3.8  Chloride 98 - 110 mmol/L 99 102  CO2 20 - 32 mmol/L 29 26  Calcium 8.6 - 10.4 mg/dL 9.9 9.5  Total Protein 6.1 - 8.1 g/dL 7.7 -  Total Bilirubin 0.2 - 1.2 mg/dL 0.4 -  AST 10 - 35 U/L 16 -  ALT 6 - 29 U/L 11 -    Imaging: Mm 3d Screen Breast Bilateral  Result Date: 11/30/2017 CLINICAL DATA:  Screening. EXAM: DIGITAL SCREENING BILATERAL MAMMOGRAM WITH TOMO AND CAD COMPARISON:  Previous exam(s). ACR Breast Density Category c: The breast tissue is heterogeneously dense, which may obscure small masses. FINDINGS: There are no findings suspicious for malignancy. Images were processed with CAD. IMPRESSION: No mammographic evidence of malignancy. A result letter of this screening mammogram will be mailed directly to the patient. RECOMMENDATION: Screening mammogram in one year. (Code:SM-B-01Y) BI-RADS CATEGORY  1: Negative. Electronically Signed   By: Franki Cabot M.D.   On: 11/30/2017 13:01    Speciality Comments: No specialty comments available.    Procedures:  No procedures performed Allergies: Wellbutrin [bupropion]   Assessment / Plan:     Visit Diagnoses: Reactive  arthritis ([place]) - +CCP, +HLA B27: She has no synovitis on exam today.  She has not had any recent knee joint effusions.  She reports intermittent swelling in the left ankle joint.  No tenderness or synovitis was noted.  She will be started on methotrexate subcutaneous injections once weekly in the office.  Consent was obtained today.   Drug Counseling TB Gold: Negative 11/16/17 Hepatitis panel: Negative 11/16/17 Immunoglobulins: WNL 03/12/15 SPEP WNL 03/12/15  Chest-xray:  03/19/15 no acute cardiopulmonary process   Contraception: Post-menopausal   Alcohol use: Discussed the avoidance of alcohol   Patient was counseled on the purpose, proper use, and adverse effects of methotrexate including nausea, infection, and signs and symptoms of pneumonitis.  Reviewed instructions with patient to take methotrexate weekly along with folic acid daily.  Discussed the importance of frequent monitoring of kidney and liver function and blood counts, and provided patient with standing lab instructions.  Counseled patient to avoid NSAIDs  and alcohol while on methotrexate.  Provided patient with educational materials on methotrexate and answered all questions.  Advised patient to get annual influenza vaccine and to get a pneumococcal vaccine if patient has not already had one.  Patient voiced understanding.  Patient consented to methotrexate use.  Will upload into chart.    Recurrent iritis, unspecified laterality - She has had recurrent bilateral iritis three times since April 2019.  Her iritis was previously in remission since 2016.  She continues to follow up with Dr. Sabra Heck. Dr. Sabra Heck plans to perform intraocular steroid injections if she continues to have recurrent flares.  He recommended starting on an immunosuppressant therapy.  At her last visit was discussed starting her on Rasuvo subcutaneous injections once weekly.  Her insurance denied Rasuvo.  We will move forward with methotrexate subcutaneous vital and syringe  once weekly.  She will come weekly for these injections at a nurse visit.  Indications, contraindications, and side effects of methotrexate were discussed.  All questions were addressed.  Consent was obtained.  A prescription for methotrexate and folic acid was sent to the pharmacy.   High risk medication use: MTX subcutaneous injections once weekly.  She will have lab work in 2 weeks x 2 in 2 months and every 3 months.  Raynaud's disease without gangrene: She continues to have symptoms of Raynaud's.  No signs of gangrene or digital ulcerations.   History of depression    Orders: No orders of the defined types were placed in this encounter.  Meds ordered this encounter  Medications  . methotrexate 50 MG/2ML injection    Sig: Inject 0.6 once weekly    Dispense:  3 mL    Refill:  0  . Tuberculin-Allergy Syringes 27G X 1/2" 1 ML MISC    Sig: Use to inject methotrexate weekly.    Dispense:  12 each    Refill:  0  . folic acid (FOLVITE) 1 MG tablet    Sig: Take 2 tablets (2 mg total) by mouth daily.    Dispense:  180 tablet    Refill:  1    Face-to-face time spent with patient was 30 minutes. >50% of time was spent in counseling and coordination of care.  Follow-Up Instructions: Return in about 3 months (around 03/16/2018) for Reactive arthritis, Iritis .   Ofilia Neas, PA-C  I examined and evaluated the patient with Hazel Sams PA.  Patient has been having recurrent iritis.  Labs were discussed at length.  After reviewing different treatment options and their side effects we decided to proceed with subcu methotrexate.  Patient wants to come in here for weekly injection in the beginning..  The plan of care was discussed as noted above.  Bo Merino, MD  Note - This record has been created using Editor, commissioning.  Chart creation errors have been sought, but may not always  have been located. Such creation errors do not reflect on  the standard of medical care.

## 2017-12-14 ENCOUNTER — Encounter: Payer: Self-pay | Admitting: Rheumatology

## 2017-12-14 ENCOUNTER — Ambulatory Visit: Payer: BLUE CROSS/BLUE SHIELD | Admitting: Rheumatology

## 2017-12-14 VITALS — BP 106/67 | HR 59 | Ht 67.5 in | Wt 132.0 lb

## 2017-12-14 DIAGNOSIS — Z79899 Other long term (current) drug therapy: Secondary | ICD-10-CM | POA: Diagnosis not present

## 2017-12-14 DIAGNOSIS — H20029 Recurrent acute iridocyclitis, unspecified eye: Secondary | ICD-10-CM | POA: Diagnosis not present

## 2017-12-14 DIAGNOSIS — I7300 Raynaud's syndrome without gangrene: Secondary | ICD-10-CM

## 2017-12-14 DIAGNOSIS — Z8659 Personal history of other mental and behavioral disorders: Secondary | ICD-10-CM | POA: Diagnosis not present

## 2017-12-14 DIAGNOSIS — M0230 Reiter's disease, unspecified site: Secondary | ICD-10-CM | POA: Diagnosis not present

## 2017-12-14 MED ORDER — FOLIC ACID 1 MG PO TABS: 2.0000 mg | ORAL_TABLET | Freq: Every day | ORAL | 1 refills | Status: DC

## 2017-12-14 MED ORDER — "TUBERCULIN-ALLERGY SYRINGES 27G X 1/2"" 1 ML MISC": 0 refills | Status: DC

## 2017-12-14 MED ORDER — METHOTREXATE SODIUM CHEMO INJECTION 50 MG/2ML: INTRAMUSCULAR | 0 refills | Status: DC

## 2017-12-14 NOTE — Addendum Note (Signed)
Addended by: Shona Needles on: 12/14/2017 03:03 PM   Modules accepted: Orders

## 2017-12-14 NOTE — Patient Instructions (Addendum)
Methotrexate subcutaneous injection What is this medicine? METHOTREXATE (METH oh TREX ate) is a cytotoxic drug that also suppresses the immune system. It is used to treat psoriasis and rheumatoid arthritis. This medicine may be used for other purposes; ask your health care provider or pharmacist if you have questions. COMMON BRAND NAME(S): Otrexup, Rasuvo What should I tell my health care provider before I take this medicine? They need to know if you have any of these conditions: -fluid in the stomach area or lungs -if you often drink alcohol -infection or immune system problems -kidney disease -liver disease -low blood counts, like low white cell, platelet, or red cell counts -lung disease -radiation therapy -stomach ulcers -ulcerative colitis -an unusual or allergic reaction to methotrexate, other medicines, foods, dyes, or preservatives -pregnant or trying to get pregnant -breast-feeding How should I use this medicine? This medicine is for injection under the skin. You will be taught how to prepare and give this medicine. Refer to the Instructions for Use that come with your medication packaging. Use exactly as directed. Take your medicine at regular intervals. Do not take your medicine more often than directed. This medicine should be taken weekly, NOT daily. It is important that you put your used needles and syringes in a special sharps container. Do not put them in a trash can. If you do not have a sharps container, call your pharmacist of healthcare provider to get one. Talk to your pediatrician regarding the use of this medicine in children. While this drug may be prescribed for children as young as 2 years for selected conditions, precautions do apply. Overdosage: If you think you have taken too much of this medicine contact a poison control center or emergency room at once. NOTE: This medicine is only for you. Do not share this medicine with others. What if I miss a dose? If you  are not sure if this medicine was injected or if you have a hard time giving the injection, do not inject another dose. Talk with your doctor or health care professional. What may interact with this medicine? This medicine may interact with the following medications: -acitretin -aspirin or aspirin-like medicines including salicylates -azathioprine -certain antibiotics like chloramphenicol, penicillin, tetracycline -cyclosporine -gold -hydroxychloroquine -live virus vaccines -mercaptopurine -NSAIDs, medicines for pain and inflammation, like ibuprofen or naproxen -other cytotoxic agents -penicillamine -phenylbutazone -phenytoin -probenacid -retinoids such as isotretinoin and tretinoin -steroid medicines like prednisone or cortisone -sulfonamides like sulfasalazine and trimethoprim/sulfamethoxazole -theophylline This list may not describe all possible interactions. Give your health care provider a list of all the medicines, herbs, non-prescription drugs, or dietary supplements you use. Also tell them if you smoke, drink alcohol, or use illegal drugs. Some items may interact with your medicine. What should I watch for while using this medicine? Avoid alcoholic drinks. This medicine can make you more sensitive to the sun. Keep out of the sun. If you cannot avoid being in the sun, wear protective clothing and use sunscreen. Do not use sun lamps or tanning beds/booths. You may get drowsy or dizzy. Do not drive, use machinery, or do anything that needs mental alertness until you know how this medicine affects you. Do not stand or sit up quickly, especially if you are an older patient. This reduces the risk of dizzy or fainting spells. You may need blood work done while you are taking this medicine. Call your doctor or health care professional for advice if you get a fever, chills or sore throat, or other symptoms of  a cold or flu. Do not treat yourself. This drug decreases your body's ability to  fight infections. Try to avoid being around people who are sick. This medicine may increase your risk to bruise or bleed. Call your doctor or health care professional if you notice any unusual bleeding. Check with your doctor or health care professional if you get an attack of severe diarrhea, nausea and vomiting, or if you sweat a lot. The loss of too much body fluid can make it dangerous for you to take this medicine. Talk to your doctor about your risk of cancer. You may be more at risk for certain types of cancers if you take this medicine. Both men and women must use effective birth control with this medicine. Do not become pregnant while taking this medicine or until at least 1 normal menstrual cycle has occurred after stopping it. Women should inform their doctor if they wish to become pregnant or think they might be pregnant. Men should not father a child while taking this medicine and for 3 months after stopping it. There is a potential for serious side effects to an unborn child. Talk to your health care professional or pharmacist for more information. Do not breast-feed an infant while taking this medicine. What side effects may I notice from receiving this medicine? Side effects that you should report to your doctor or health care professional as soon as possible: -allergic reactions like skin rash, itching or hives, swelling of the face, lips, or tongue -breathing problems or shortness of breath -diarrhea -dry, nonproductive cough -low blood counts - this medicine may decrease the number of white blood cells, red blood cells and platelets. You may be at increased risk of infections and bleeding -mouth sores -redness, blistering, peeling or loosening of the skin, including inside the mouth -signs of infection - fever or chills, cough, sore throat, pain or difficulty passing urine -signs and symptoms of bleeding such as bloody or black, tarry stools; red or dark-brown urine; spitting up  blood or brown material that looks like coffee grounds; red spots on the skin; unusual bruising or bleeding from the eye, gums, or nose -signs and symptoms of kidney injury like trouble passing urine or change in the amount of urine -signs and symptoms of liver injury like dark yellow or brown urine; general ill feeling or flu-like symptoms; light-colored stools; loss of appetite; nausea; right upper belly pain; unusually weak or tired; yellowing of the eyes or skin Side effects that usually do not require medical attention (report to your doctor or health care professional if they continue or are bothersome): -dizziness -hair loss -headache -stomach pain -upset stomach -vomiting This list may not describe all possible side effects. Call your doctor for medical advice about side effects. You may report side effects to FDA at 1-800-FDA-1088. Where should I keep my medicine? Keep out of the reach of children. You will be instructed on how to store this medicine. Throw away any unused medicine after the expiration date on the label. NOTE: This sheet is a summary. It may not cover all possible information. If you have questions about this medicine, talk to your doctor, pharmacist, or health care provider.  2018 Elsevier/Gold Standard (2015-07-25 11:50:46)  Standing Labs We placed an order today for your standing lab work.    Please come back and get your standing labs in 2 weeks x2, then 2 months, then every 3 months  We have open lab Monday through Friday from 8:30-11:30 AM and  1:30-4:00 PM  at the office of Dr. Bo Merino.   You may experience shorter wait times on Monday and Friday afternoons. The office is located at [address] [place], [place], [place] [address] No appointment is necessary.   Labs are drawn by [facility].  You may receive a bill from [facility] for your lab work. If you have any questions regarding directions or hours of operation,  please call [phone].

## 2017-12-15 ENCOUNTER — Ambulatory Visit: Payer: Self-pay

## 2017-12-16 ENCOUNTER — Ambulatory Visit: Payer: Self-pay

## 2017-12-16 ENCOUNTER — Ambulatory Visit: Payer: BLUE CROSS/BLUE SHIELD

## 2017-12-20 DIAGNOSIS — H43813 Vitreous degeneration, bilateral: Secondary | ICD-10-CM | POA: Diagnosis not present

## 2017-12-22 ENCOUNTER — Ambulatory Visit (INDEPENDENT_AMBULATORY_CARE_PROVIDER_SITE_OTHER): Payer: BLUE CROSS/BLUE SHIELD | Admitting: *Deleted

## 2017-12-22 DIAGNOSIS — H20023 Recurrent acute iridocyclitis, bilateral: Secondary | ICD-10-CM | POA: Diagnosis not present

## 2017-12-22 NOTE — Progress Notes (Signed)
Patient in office for a nurse visit for Methotrexate. Patient is here for teaching on self injecting the methotrexate. Patient was advised on how to draw up medication from the vial and to inject the medication. Patient was able to give a demonstration with proper technique.

## 2017-12-27 DIAGNOSIS — F3342 Major depressive disorder, recurrent, in full remission: Secondary | ICD-10-CM | POA: Diagnosis not present

## 2017-12-27 DIAGNOSIS — H43811 Vitreous degeneration, right eye: Secondary | ICD-10-CM | POA: Diagnosis not present

## 2018-01-10 ENCOUNTER — Other Ambulatory Visit: Payer: Self-pay | Admitting: Rheumatology

## 2018-01-10 ENCOUNTER — Telehealth: Payer: Self-pay | Admitting: Rheumatology

## 2018-01-10 NOTE — Telephone Encounter (Signed)
[facility] [place]   Med refill  Methotrexate

## 2018-01-10 NOTE — Telephone Encounter (Signed)
Prescription has been sent to the pharmacy via electronic request.

## 2018-01-10 NOTE — Telephone Encounter (Signed)
Last visit: 12/14/2017 Next visit: message sent to the front desk to schedule patient.  Labs: 11/16/2017 WNL   Okay to refill per Dr. Estanislado Pandy.

## 2018-01-11 ENCOUNTER — Telehealth: Payer: Self-pay | Admitting: Rheumatology

## 2018-01-11 NOTE — Telephone Encounter (Signed)
Patient called stating she has only had one injection of the Methotrexate since her appointment with Seth Bake on 12/22/17.  Patient states she was unable to give herself an injection this week due to not having enough medicine.  Patient states she has questions if she should give herself the injection when the medication is filled or wait til Sunday when she normally takes her injection.

## 2018-01-11 NOTE — Telephone Encounter (Addendum)
Patient has not given herself an injection since the injection we did here in the office. Patient states she is out of medication and is unable to give herself the medication. Patient advised a new prescription has been sent to the pharmacy and she will need to come for labs next week.

## 2018-01-29 ENCOUNTER — Other Ambulatory Visit: Payer: Self-pay | Admitting: Rheumatology

## 2018-01-31 NOTE — Telephone Encounter (Signed)
Last visit: 12/14/2017 Next visit: 03/28/18 Labs: 11/16/2017 WNL   Okay to refill per Dr. Estanislado Pandy

## 2018-03-14 NOTE — Progress Notes (Deleted)
   Office Visit Note  Patient: Alexis Payne             Date of Birth: 1955-08-27           MRN: [id number]             PCP: Leeroy Cha, MD Referring: Leeroy Cha,* Visit Date: 03/28/2018 Occupation: @GUAROCC @  Subjective:  No chief complaint on file.   History of Present Illness: Alexis Payne is a 62 y.o. female ***   Activities of Daily Living:  Patient reports morning stiffness for *** {minute/hour:19697}.   Patient {ACTIONS;DENIES/REPORTS:21021675::"Denies"} nocturnal pain.  Difficulty dressing/grooming: {ACTIONS;DENIES/REPORTS:21021675::"Denies"} Difficulty climbing stairs: {ACTIONS;DENIES/REPORTS:21021675::"Denies"} Difficulty getting out of chair: {ACTIONS;DENIES/REPORTS:21021675::"Denies"} Difficulty using hands for taps, buttons, cutlery, and/or writing: {ACTIONS;DENIES/REPORTS:21021675::"Denies"}  No Rheumatology ROS completed.   PMFS History:  There are no active problems to display for this patient.   Past Medical History:  Diagnosis Date  . Reactive arthritis (HCC)     Family History  Problem Relation Age of Onset  . Pulmonary fibrosis Mother   . Heart disease Mother    Past Surgical History:  Procedure Laterality Date  . BREAST EXCISIONAL BIOPSY Right 1980   benign  . COLONOSCOPY WITH PROPOFOL N/A 08/31/2016   Procedure: COLONOSCOPY WITH PROPOFOL;  Surgeon: Garlan Fair, MD;  Location: [facility];  Service: Endoscopy;  Laterality: N/A;  . FRACTURE SURGERY     right shoulder  . HUMERUS SURGERY     Social History   Social History Narrative  . Not on file    Objective: Vital Signs: There were no vitals taken for this visit.   Physical Exam   Musculoskeletal Exam: ***  CDAI Exam: CDAI Score: Not documented Patient Global Assessment: Not documented; Provider Global Assessment: Not documented Swollen: Not documented; Tender: Not documented Joint Exam   Not documented   There is currently no information  documented on the homunculus. Go to the Rheumatology activity and complete the homunculus joint exam.  Investigation: No additional findings.  Imaging: No results found.  Recent Labs: Lab Results  Component Value Date   WBC 5.7 11/16/2017   HGB 12.9 11/16/2017   PLT 294 11/16/2017   NA 136 11/16/2017   K 3.8 11/16/2017   CL 99 11/16/2017   CO2 29 11/16/2017   GLUCOSE 88 11/16/2017   BUN 14 11/16/2017   CREATININE 0.79 11/16/2017   BILITOT 0.4 11/16/2017   AST 16 11/16/2017   ALT 11 11/16/2017   PROT 7.7 11/16/2017   CALCIUM 9.9 11/16/2017   GFRAA 93 11/16/2017   QFTBGOLDPLUS NEGATIVE 11/16/2017    Speciality Comments: No specialty comments available.  Procedures:  No procedures performed Allergies: Wellbutrin [bupropion]   Assessment / Plan:     Visit Diagnoses: No diagnosis found.   Orders: No orders of the defined types were placed in this encounter.  No orders of the defined types were placed in this encounter.   Face-to-face time spent with patient was *** minutes. Greater than 50% of time was spent in counseling and coordination of care.  Follow-Up Instructions: No follow-ups on file.   Earnestine Mealing, CMA  Note - This record has been created using Editor, commissioning.  Chart creation errors have been sought, but may not always  have been located. Such creation errors do not reflect on  the standard of medical care.

## 2018-03-17 DIAGNOSIS — H43393 Other vitreous opacities, bilateral: Secondary | ICD-10-CM | POA: Diagnosis not present

## 2018-03-17 DIAGNOSIS — H2510 Age-related nuclear cataract, unspecified eye: Secondary | ICD-10-CM | POA: Diagnosis not present

## 2018-03-21 ENCOUNTER — Other Ambulatory Visit: Payer: Self-pay | Admitting: Rheumatology

## 2018-03-21 NOTE — Telephone Encounter (Signed)
Last visit: 12/14/2017 Next visit: 03/28/2018 Labs: 11/16/2017 WNL   Patient is due to update labs, will update them at appt on 03/28/2018.   Okay to refill 30 day supply, per Dr. Estanislado Pandy.

## 2018-03-28 ENCOUNTER — Ambulatory Visit: Payer: BLUE CROSS/BLUE SHIELD | Admitting: Rheumatology

## 2018-05-26 DIAGNOSIS — H9313 Tinnitus, bilateral: Secondary | ICD-10-CM | POA: Diagnosis not present

## 2018-05-26 DIAGNOSIS — R1012 Left upper quadrant pain: Secondary | ICD-10-CM | POA: Diagnosis not present

## 2018-05-26 DIAGNOSIS — Z23 Encounter for immunization: Secondary | ICD-10-CM | POA: Diagnosis not present

## 2018-06-07 DIAGNOSIS — H9041 Sensorineural hearing loss, unilateral, right ear, with unrestricted hearing on the contralateral side: Secondary | ICD-10-CM | POA: Diagnosis not present

## 2018-06-07 DIAGNOSIS — H9113 Presbycusis, bilateral: Secondary | ICD-10-CM | POA: Diagnosis not present

## 2018-06-07 DIAGNOSIS — H9319 Tinnitus, unspecified ear: Secondary | ICD-10-CM | POA: Diagnosis not present

## 2018-06-07 DIAGNOSIS — H9313 Tinnitus, bilateral: Secondary | ICD-10-CM | POA: Diagnosis not present

## 2018-06-08 DIAGNOSIS — H5711 Ocular pain, right eye: Secondary | ICD-10-CM | POA: Diagnosis not present

## 2018-06-08 DIAGNOSIS — H20021 Recurrent acute iridocyclitis, right eye: Secondary | ICD-10-CM | POA: Diagnosis not present

## 2018-06-22 ENCOUNTER — Other Ambulatory Visit: Payer: Self-pay | Admitting: Internal Medicine

## 2018-06-22 DIAGNOSIS — R1012 Left upper quadrant pain: Secondary | ICD-10-CM

## 2018-06-27 ENCOUNTER — Ambulatory Visit
Admission: RE | Admit: 2018-06-27 | Discharge: 2018-06-27 | Disposition: A | Discharge status: Home / Self Care | Payer: BLUE CROSS/BLUE SHIELD | Source: Ambulatory Visit | Attending: Internal Medicine | Admitting: Internal Medicine

## 2018-06-27 DIAGNOSIS — K760 Fatty (change of) liver, not elsewhere classified: Secondary | ICD-10-CM | POA: Diagnosis not present

## 2018-06-27 DIAGNOSIS — R1012 Left upper quadrant pain: Secondary | ICD-10-CM

## 2018-06-27 DIAGNOSIS — K8020 Calculus of gallbladder without cholecystitis without obstruction: Secondary | ICD-10-CM | POA: Diagnosis not present

## 2018-07-08 ENCOUNTER — Other Ambulatory Visit: Payer: Self-pay

## 2018-07-08 ENCOUNTER — Emergency Department (HOSPITAL_COMMUNITY): Payer: BLUE CROSS/BLUE SHIELD

## 2018-07-08 ENCOUNTER — Encounter (HOSPITAL_COMMUNITY): Payer: Self-pay

## 2018-07-08 ENCOUNTER — Emergency Department (HOSPITAL_COMMUNITY)
Admission: EM | Admit: 2018-07-08 | Discharge: 2018-07-09 | Disposition: A | Discharge status: Home / Self Care | Payer: BLUE CROSS/BLUE SHIELD | Attending: Emergency Medicine | Admitting: Emergency Medicine

## 2018-07-08 ENCOUNTER — Other Ambulatory Visit: Payer: Self-pay | Admitting: Geriatric Medicine

## 2018-07-08 DIAGNOSIS — R0789 Other chest pain: Secondary | ICD-10-CM | POA: Insufficient documentation

## 2018-07-08 DIAGNOSIS — Z79899 Other long term (current) drug therapy: Secondary | ICD-10-CM | POA: Diagnosis not present

## 2018-07-08 DIAGNOSIS — R1084 Generalized abdominal pain: Secondary | ICD-10-CM | POA: Diagnosis not present

## 2018-07-08 DIAGNOSIS — R7989 Other specified abnormal findings of blood chemistry: Secondary | ICD-10-CM | POA: Diagnosis not present

## 2018-07-08 LAB — HEPATIC FUNCTION PANEL
ALT: 20 U/L (ref 0–44)
AST: 22 U/L (ref 15–41)
Albumin: 4.4 g/dL (ref 3.5–5.0)
Alkaline Phosphatase: 59 U/L (ref 38–126)
Bilirubin, Direct: 0.1 mg/dL (ref 0.0–0.2)
Indirect Bilirubin: 0.4 mg/dL (ref 0.3–0.9)
Total Bilirubin: 0.5 mg/dL (ref 0.3–1.2)
Total Protein: 7.6 g/dL (ref 6.5–8.1)

## 2018-07-08 LAB — CBC
HCT: 39.5 % (ref 36.0–46.0)
Hemoglobin: 12.6 g/dL (ref 12.0–15.0)
MCH: 31.3 pg (ref 26.0–34.0)
MCHC: 31.9 g/dL (ref 30.0–36.0)
MCV: 98.3 fL (ref 80.0–100.0)
NRBC: 0 % (ref 0.0–0.2)
Platelets: 300 10*3/uL (ref 150–400)
RBC: 4.02 MIL/uL (ref 3.87–5.11)
RDW: 12.8 % (ref 11.5–15.5)
WBC: 5.8 10*3/uL (ref 4.0–10.5)

## 2018-07-08 LAB — BASIC METABOLIC PANEL
ANION GAP: 10 (ref 5–15)
BUN: 18 mg/dL (ref 8–23)
CO2: 26 mmol/L (ref 22–32)
Calcium: 9.5 mg/dL (ref 8.9–10.3)
Chloride: 103 mmol/L (ref 98–111)
Creatinine, Ser: 0.79 mg/dL (ref 0.44–1.00)
GFR calc non Af Amer: >60 mL/min (ref >60)
Glucose, Bld: 84 mg/dL (ref 70–99)
POTASSIUM: 3.5 mmol/L (ref 3.5–5.1)
Sodium: 139 mmol/L (ref 135–145)

## 2018-07-08 LAB — LIPASE, BLOOD: Lipase: 46 U/L (ref 11–51)

## 2018-07-08 LAB — TROPONIN I: Troponin I: <0.03 ng/mL (ref <0.03)

## 2018-07-08 MED ORDER — SODIUM CHLORIDE 0.9 % IV BOLUS
1000.0000 mL | Freq: Once | INTRAVENOUS | Status: AC
  Administered 2018-07-08: 1000 mL via INTRAVENOUS

## 2018-07-08 MED ORDER — IOPAMIDOL (ISOVUE-370) INJECTION 76%
100.0000 mL | Freq: Once | INTRAVENOUS | Status: AC | PRN
  Administered 2018-07-08: 100 mL via INTRAVENOUS

## 2018-07-08 MED ORDER — IOPAMIDOL (ISOVUE-370) INJECTION 76%
INTRAVENOUS | Status: AC
  Filled 2018-07-08: qty 100

## 2018-07-08 MED ORDER — SODIUM CHLORIDE (PF) 0.9 % IJ SOLN
INTRAMUSCULAR | Status: AC
  Filled 2018-07-08: qty 50

## 2018-07-08 NOTE — ED Triage Notes (Signed)
Patient was seen at her PCP office this afternoon and was found to have an elevated d-dimer. Patient was sent to the ED to have a  CT angio.

## 2018-07-08 NOTE — ED Notes (Signed)
Pt requesting to lay down for blood draw. Unable to draw at this time.

## 2018-07-08 NOTE — Discharge Instructions (Addendum)
You have been diagnosed today with Atypical Chest Pain.  At this time there does not appear to be the presence of an emergent medical condition, however there is always the potential for conditions to change. Please read and follow the below instructions.  Please return to the Emergency Department immediately for any new or worsening symptoms. Please be sure to follow up with your Primary Care Provider this week regarding your visit today; please call their office to schedule an appointment even if you are feeling better for a follow-up visit. Please call the [facility] health medical cardiology group tomorrow morning for follow-up regarding your visit today.  Get help right away if: Your chest pain gets worse or changes in nature. You have a cough that gets worse, or you cough up blood. You have severe pain in your abdomen. You faint. You have fever You have sudden, unexplained chest discomfort. You have sudden, unexplained discomfort in your arms, back, neck, or jaw. You have shortness of breath at any time. You suddenly start to sweat, or your skin gets clammy. You feel nausea or you vomit. You suddenly feel lightheaded or dizzy. You have severe weakness, or unexplained weakness or fatigue. Your heart begins to beat quickly, or it feels like it is skipping beats.  Please read the additional information packets attached to your discharge summary.  Do not take your medicine if  develop an itchy rash, swelling in your mouth or lips, or difficulty breathing.

## 2018-07-08 NOTE — ED Notes (Signed)
Patient transported to CT 

## 2018-07-08 NOTE — ED Provider Notes (Signed)
[place] DEPT Provider Note   CSN: [id number] Arrival date & time: 07/08/18  1759     History   Chief Complaint Chief Complaint  Patient presents with  . Abnormal Lab    elevated d dimer  . Chest Pain    HPI Alexis Payne is a 63 y.o. female presenting today at request of primary care provider for pulmonary embolism rule out.  Patient reports that she had a positive D-dimer test at her primary care office today.  Patient reports 3 weeks of left-sided chest pain, constant, sharp, moderate intensity without aggravating or alleviating factors.  Patient recently brought this up to her primary care provider which prompted the d-dimer study.  Patient denies shortness of breath or change to her pain.  Additionally patient reports that she developed a left upper quadrant pain that she states occasionally feels as if it is underneath her left sided ribs for the past 2.5 months.  Patient states that she has had an ultrasound to evaluate this which showed gallstones as well as hepatic steatosis.  Unable to find PCP notes in EMR at this time.  Patient denies fever, worsening of pain with exertion, shortness of breath, diaphoresis, n/v, diarrhea, vaginal bleeding/discharge, dizziness, headache, neck pain, back pain, extremity swelling, weakness/numbness or tingling, or any other concerns at this time.  HPI  Past Medical History:  Diagnosis Date  . Reactive arthritis ([place])     There are no active problems to display for this patient.   Past Surgical History:  Procedure Laterality Date  . BREAST EXCISIONAL BIOPSY Right 1980   benign  . COLONOSCOPY WITH PROPOFOL N/A 08/31/2016   Procedure: COLONOSCOPY WITH PROPOFOL;  Surgeon: Garlan Fair, MD;  Location: [facility];  Service: Endoscopy;  Laterality: N/A;  . FRACTURE SURGERY     right shoulder  . HUMERUS SURGERY       OB History   No obstetric history on file.      Home Medications     Prior to Admission medications   Medication Sig Start Date End Date Taking? Authorizing Provider  Calcium Carbonate-Vitamin D (CALCIUM-VITAMIN D3 PO) Take 1 tablet by mouth daily after breakfast. 1200 mg-25 mcg    [provider]  FLUoxetine (PROZAC) 20 MG tablet Take 20 mg by mouth every morning.    [provider]  folic acid (FOLVITE) 1 MG tablet Take 2 tablets (2 mg total) by mouth daily. 12/14/17   Ofilia Neas, PA-C  homatropine 5 % ophthalmic solution Place 1 drop into both eyes See admin instructions. Tapering down dose up to 4 times daily for iritis as needed    [provider]  ibuprofen (ADVIL,MOTRIN) 200 MG tablet Take 600-800 mg by mouth every 8 (eight) hours as needed (for arthritis pain/headache.).    [provider]  methotrexate 50 MG/2ML injection INJECT 0.6ML ONCE WEEKLY 03/21/18   Bo Merino, MD  prednisoLONE acetate (PRED FORTE) 1 % ophthalmic suspension Place 1 drop into both eyes See admin instructions. Tapering dose up to 3 times daily for iritis as needed 06/01/16   [provider]  Tuberculin-Allergy Syringes 27G X 1/2" 1 ML MISC Use to inject methotrexate weekly. 12/14/17   Bo Merino, MD  Vitamin D, Cholecalciferol, 1000 units CAPS Take by mouth daily.    [provider]    Family History Family History  Problem Relation Age of Onset  . Pulmonary fibrosis Mother   . Heart disease Mother  Social History Social History   Tobacco Use  . Smoking status: Never Smoker  . Smokeless tobacco: Never Used  Substance Use Topics  . Alcohol use: Yes    Alcohol/week: 1.0 standard drinks    Types: 1 Glasses of wine per week  . Drug use: No     Allergies   Wellbutrin [bupropion]   Review of Systems Review of Systems  Constitutional: Negative.  Negative for chills and fever.  HENT: Negative.  Negative for rhinorrhea and sore throat.   Eyes: Negative.  Negative for visual disturbance.   Respiratory: Negative.  Negative for cough and shortness of breath.   Cardiovascular: Positive for chest pain.  Gastrointestinal: Positive for abdominal pain. Negative for diarrhea, nausea and vomiting.  Genitourinary: Negative.  Negative for dysuria and hematuria.  Musculoskeletal: Negative.  Negative for arthralgias and myalgias.  Skin: Negative.  Negative for rash.  Neurological: Negative.  Negative for dizziness, syncope, weakness and headaches.   Physical Exam Updated Vital Signs BP 119/67 (BP Location: Right Arm)   Pulse 65   Temp 98.1 F (36.7 C) (Oral)   Resp 17   Ht 5' 7.5" (1.715 m)   Wt 59.9 kg   SpO2 100%   BMI 20.37 kg/m   Physical Exam Constitutional:      General: She is not in acute distress.    Appearance: She is well-developed.  HENT:     Head: Normocephalic and atraumatic.     Right Ear: External ear normal.     Left Ear: External ear normal.     Nose: Nose normal.     Mouth/Throat:     Lips: Pink.     Mouth: Mucous membranes are moist.     Pharynx: Oropharynx is clear. Uvula midline.  Eyes:     General: Vision grossly intact. Gaze aligned appropriately.     Extraocular Movements: Extraocular movements intact.     Conjunctiva/sclera: Conjunctivae normal.     Pupils: Pupils are equal, round, and reactive to light.  Neck:     Musculoskeletal: Full passive range of motion without pain, normal range of motion and neck supple.     Trachea: Trachea and phonation normal. No tracheal deviation.  Cardiovascular:     Rate and Rhythm: Normal rate.     Pulses:          Dorsalis pedis pulses are 2+ on the right side and 2+ on the left side.       Posterior tibial pulses are 2+ on the right side and 2+ on the left side.     Heart sounds: Normal heart sounds.  Pulmonary:     Effort: Pulmonary effort is normal. No respiratory distress.     Breath sounds: Normal breath sounds and air entry.  Chest:     Chest wall: Tenderness present. No deformity or crepitus.        Comments: Tenderness to light palpation of the sternum and left rib musculature Abdominal:     General: Bowel sounds are normal.     Palpations: Abdomen is soft.     Tenderness: There is no abdominal tenderness. There is no guarding or rebound. Negative signs include Murphy's sign, Rovsing's sign and McBurney's sign.    Musculoskeletal: Normal range of motion.     Right lower leg: Normal.     Left lower leg: Normal.  Feet:     Right foot:     Protective Sensation: 3 sites tested. 3 sites sensed.     Left foot:  Protective Sensation: 3 sites tested. 3 sites sensed.  Skin:    General: Skin is warm and dry.     Capillary Refill: Capillary refill takes less than 2 seconds.  Neurological:     General: No focal deficit present.     Mental Status: She is alert.     GCS: GCS eye subscore is 4. GCS verbal subscore is 5. GCS motor subscore is 6.     Comments: Speech is clear and goal oriented, follows commands Major Cranial nerves without deficit, no facial droop Normal strength in upper and lower extremities bilaterally including dorsiflexion and plantar flexion, strong and equal grip strength Sensation normal to light touch Moves extremities without ataxia, coordination intact Normal gait  Psychiatric:        Mood and Affect: Mood normal.        Behavior: Behavior normal.    ED Treatments / Results  Labs (all labs ordered are listed, but only abnormal results are displayed) Labs Reviewed  BASIC METABOLIC PANEL  CBC  TROPONIN I  HEPATIC FUNCTION PANEL  LIPASE, BLOOD    EKG EKG Interpretation  Date/Time:  Friday July 08 2018 21:05:43 EST Ventricular Rate:  53 PR Interval:    QRS Duration: 77 QT Interval:  432 QTC Calculation: 406 R Axis:   98 Text Interpretation:  Sinus rhythm Right axis deviation Borderline low voltage, extremity leads normal, no change from previous Confirmed by Charlesetta Shanks [phone]) on 07/08/2018 11:06:46 PM   Radiology Dg Chest 2  View  Result Date: 07/08/2018 CLINICAL DATA:  Elevated D-dimer with left chest pain. EXAM: CHEST - 2 VIEW COMPARISON:  05/12/2016 FINDINGS: Lungs are clear. Cardiomediastinal silhouette and remainder the exam is unchanged. IMPRESSION: No active cardiopulmonary disease. Electronically Signed   By: Marin Olp M.D.   On: 07/08/2018 20:05   Ct Angio Chest Pe W And/or Wo Contrast  Result Date: 07/08/2018 CLINICAL DATA:  PE suspected, intermediate prob, positive D-dimer. Chest tightness and dyspnea. EXAM: CT ANGIOGRAPHY CHEST WITH CONTRAST TECHNIQUE: Multidetector CT imaging of the chest was performed using the standard protocol during bolus administration of intravenous contrast. Multiplanar CT image reconstructions and MIPs were obtained to evaluate the vascular anatomy. CONTRAST:  134mL ISOVUE-370 IOPAMIDOL (ISOVUE-370) INJECTION 76% COMPARISON:  Chest radiograph earlier this day, additional radiographs reviewed. FINDINGS: Cardiovascular: There are no filling defects within the pulmonary arteries to suggest pulmonary embolus. The thoracic aorta is normal in caliber, limited assessment for dissection as examination is tailored for pulmonary embolus evaluation. The heart is normal in size. No pericardial effusion. Mediastinum/Nodes: No enlarged mediastinal or hilar lymph nodes. The esophagus is decompressed. No visualized thyroid nodule. Lungs/Pleura: Minor hypoventilatory atelectasis at the bases. No confluent airspace disease. No pulmonary edema or pleural fluid. No pulmonary mass. The trachea and mainstem bronchi are patent. Upper Abdomen: No acute abnormality. Musculoskeletal: There are no acute or suspicious osseous abnormalities. Irregularity of the right proximal humerus is chronic and similar to prior exams. Review of the MIP images confirms the above findings. IMPRESSION: No pulmonary embolus or acute intrathoracic abnormality. Electronically Signed   By: Keith Rake M.D.   On: 07/08/2018 22:44     Procedures Procedures (including critical care time)  Medications Ordered in ED Medications  sodium chloride (PF) 0.9 % injection (has no administration in time range)  sodium chloride 0.9 % bolus 1,000 mL (1,000 mLs Intravenous New Bag/Given 07/08/18 2122)  iopamidol (ISOVUE-370) 76 % injection 100 mL (100 mLs Intravenous Contrast Given 07/08/18 2212)  Initial Impression / Assessment and Plan / ED Course  I have reviewed the triage vital signs and the nursing notes.  Pertinent labs & imaging results that were available during my care of the patient were reviewed by me and considered in my medical decision making (see chart for details).    21:39 PM: 62 year old female arrives from primary care office following positive d-dimer after 3 weeks of chest pain and 2 months of left upper quadrant pain.  Patient is not tachycardic or hypoxic on arrival.  There is no respiratory distress and she denies recent change in her pain.  She is overall well-appearing/nontoxic.  Examination reveals some tenderness to palpation of the chest.  Triage chest x-ray nonacute.  CBC unremarkable.  BMP and troponin are pending.  I have added LFTs and lipase for patient's left upper quadrant pain, additionally CT angios of the chest has been ordered to evaluate for pulmonary embolism.  Discussed with Dr. Johnney Killian who agrees with workup. ---------------------------------------- CBC within normal limits BMP within normal limits Lipase within normal limits LFTs within normal limits Chest x-ray without acute findings CT angios chest without acute findings EKG without acute findings reviewed by Dr. Vallery Ridge Troponin pending, discussed multiple times with lab about delay ----------------- Patient reevaluated, resting comfortably and in no acute distress.  Vital signs stable.  Rediscussed case with Dr. Johnney Killian, pending negative troponin, discharge with PCP and Cardiology  follow-up. --------------------- Troponin negative, patient with constant symptoms for 3 weeks not worsening, discussed with Dr. Vallery Ridge, delta troponin not warranted at this time. ------------ Patient is to be discharged with recommendation to follow up with PCP and cardiology in regards to today's hospital visit. Chest pain is not likely of cardiac or pulmonary etiology d/t presentation, negative CTA chest, VSS, no tracheal deviation, no JVD or new murmur, RRR, breath sounds equal bilaterally, EKG without acute abnormalities, negative troponin, and negative CXR.  Pedal pulses present and patient pain unchanged for 3 weeks, no back pain or extremity weakness/numbness, doubt dissection at this time. Patient has been advised return to the ED is CP becomes exertional, associated with diaphoresis or nausea, radiates to left jaw/arm, worsens or becomes concerning in any way. Pt appears reliable for follow up and is agreeable to discharge.   Additionally patient's left lower rib pain/LUQ abd pain. Patient is afebrile, non-toxic appearing, sitting comfortably on examination table. Fluid bolus given. Labs, imaging and vitals reviewed.  Patient does not meet the SIRS or Sepsis criteria.  On repeat exam patient does not have a surgical abdomen and there are no peritoneal signs.  No indication of appendicitis, bowel obstruction, bowel perforation, cholecystitis, diverticulitis, or other acute intraabdominal/retroperitoneal processes.  Afebrile, pulse 65 bpm, blood pressure 119/67, respirations 17, SPO2 100% on room air, patient is sitting comfortably and in no acute distress.  At this time there does not appear to be any evidence of an acute emergency medical condition and the patient appears stable for discharge with appropriate outpatient follow up. Diagnosis was discussed with patient who verbalizes understanding of care plan and is agreeable to discharge. I have discussed return precautions with patient and  husband who verbalize understanding of return precautions. Patient strongly encouraged to follow-up with their PCP and cardiology this week. All questions answered.  Patient's case discussed with Dr. Johnney Killian who agrees with plan to discharge with follow-up.   Note: Portions of this report may have been transcribed using voice recognition software. Every effort was made to ensure accuracy; however, inadvertent computerized transcription errors  may still be present.  Final Clinical Impressions(s) / ED Diagnoses   Final diagnoses:  Atypical chest pain    ED Discharge Orders    None       Gari Crown 07/08/18 2348    Charlesetta Shanks, MD 07/10/18 2212

## 2018-07-12 ENCOUNTER — Ambulatory Visit
Admission: RE | Admit: 2018-07-12 | Discharge: 2018-07-12 | Disposition: A | Discharge status: Home / Self Care | Payer: BLUE CROSS/BLUE SHIELD | Source: Ambulatory Visit | Attending: Geriatric Medicine | Admitting: Geriatric Medicine

## 2018-07-12 DIAGNOSIS — R1084 Generalized abdominal pain: Secondary | ICD-10-CM

## 2018-07-12 DIAGNOSIS — K8020 Calculus of gallbladder without cholecystitis without obstruction: Secondary | ICD-10-CM | POA: Diagnosis not present

## 2018-07-12 DIAGNOSIS — K5730 Diverticulosis of large intestine without perforation or abscess without bleeding: Secondary | ICD-10-CM | POA: Diagnosis not present

## 2018-07-12 MED ORDER — IOHEXOL 300 MG/ML  SOLN
100.0000 mL | Freq: Once | INTRAMUSCULAR | Status: AC | PRN
  Administered 2018-07-12: 100 mL via INTRAVENOUS

## 2018-07-16 ENCOUNTER — Other Ambulatory Visit: Payer: Self-pay | Admitting: Rheumatology

## 2018-07-26 ENCOUNTER — Ambulatory Visit: Payer: BLUE CROSS/BLUE SHIELD | Admitting: Cardiovascular Disease

## 2018-07-26 ENCOUNTER — Encounter: Payer: Self-pay | Admitting: Cardiovascular Disease

## 2018-07-26 VITALS — BP 110/70 | HR 64 | Ht 67.0 in | Wt 135.8 lb

## 2018-07-26 DIAGNOSIS — R0989 Other specified symptoms and signs involving the circulatory and respiratory systems: Secondary | ICD-10-CM | POA: Diagnosis not present

## 2018-07-26 DIAGNOSIS — M940 Chondrocostal junction syndrome [Tietze]: Secondary | ICD-10-CM

## 2018-07-26 MED ORDER — IBUPROFEN 200 MG PO CAPS: 400.0000 mg | ORAL_CAPSULE | Freq: Three times a day (TID) | ORAL | 0 refills | Status: AC

## 2018-07-26 NOTE — Patient Instructions (Signed)
Medication Instructions:  Your physician has recommended you make the following change in your medication:  START Ibuprofen (Motrin, Advil) 400 mg 3 times per day for 7 days then as needed  If you need a refill on your cardiac medications before your next appointment, please call your pharmacy.    Lab work: None Ordered    Testing/Procedures: Your physician has requested that you have an abdominal ultrasound of your aorta, iliac and IVC vessels. During this test, an ultrasound is used to evaluate the aorta. Allow 30 minutes for this exam. Do not eat after midnight the day before and avoid carbonated beverages   Follow-Up: Your physician recommends that you schedule a follow-up appointment in: 2-3 months with Dr. Acie Fredrickson

## 2018-07-26 NOTE — Progress Notes (Signed)
Cardiology Office Note:    Date:  07/26/2018   ID:  Alexis Payne, DOB 1955/09/15, MRN [id number]  PCP:  Leeroy Cha, MD  Cardiologist:    Electrophysiologist:  None   Referring MD: Leeroy Cha,*   Chief Complaint  Patient presents with  . Chest Pain    History of Present Illness:    Alexis Payne is a 63 y.o. female with a hx of arthritis who presents for further evaluation of some chest pain   Has burning in her chest for the past month Not related to change of position or , no pleuretic,  Not related to twisting torso She walks several times s a week - 1 hours 3 times a week .  Not related to arm movement .  Has arthritis - get injection ,  Tablets gave her flu like symptoms   Has occasional abdominal pain ,   She has tried pepcid  Abdominal US showed diverticuli Up to date with her colonoscopies.    Has gall stones  Mother had a pacer. Grandmother had a MI   Past Medical History:  Diagnosis Date  . Reactive arthritis [facility])     Past Surgical History:  Procedure Laterality Date  . BREAST EXCISIONAL BIOPSY Right 1980   benign  . COLONOSCOPY WITH PROPOFOL N/A 08/31/2016   Procedure: COLONOSCOPY WITH PROPOFOL;  Surgeon: Garlan Fair, MD;  Location: [facility];  Service: Endoscopy;  Laterality: N/A;  . FRACTURE SURGERY     right shoulder  . HUMERUS SURGERY      Current Medications: Current Meds  Medication Sig  . Calcium Carbonate (CALCIUM 600 PO) Take 1 Dose by mouth daily.  Marland Kitchen FLUoxetine (PROZAC) 20 MG tablet Take 20 mg by mouth every morning.  . folic acid (FOLVITE) 1 MG tablet Take 2 tablets (2 mg total) by mouth daily.  . homatropine 5 % ophthalmic solution Place 1 drop into both eyes See admin instructions. Tapering down dose up to 4 times daily for iritis as needed  . methotrexate 50 MG/2ML injection INJECT 0.6ML ONCE WEEKLY  . prednisoLONE acetate (PRED FORTE) 1 % ophthalmic suspension Place 1 drop into both eyes  See admin instructions. Tapering dose up to 3 times daily for iritis as needed  . Tuberculin-Allergy Syringes 27G X 1/2" 1 ML MISC Use to inject methotrexate weekly.  . Vitamin D, Cholecalciferol, 1000 units CAPS Take by mouth daily.     Allergies:   Wellbutrin [bupropion]   Social History   Socioeconomic History  . Marital status: Married    Spouse name: Not on file  . Number of children: Not on file  . Years of education: Not on file  . Highest education level: Not on file  Occupational History  . Not on file  Social Needs  . Financial resource strain: Not on file  . Food insecurity:    Worry: Not on file    Inability: Not on file  . Transportation needs:    Medical: Not on file    Non-medical: Not on file  Tobacco Use  . Smoking status: Never Smoker  . Smokeless tobacco: Never Used  Substance and Sexual Activity  . Alcohol use: Yes    Alcohol/week: 1.0 standard drinks    Types: 1 Glasses of wine per week  . Drug use: No  . Sexual activity: Not on file  Lifestyle  . Physical activity:    Days per week: Not on file    Minutes per session: Not  on file  . Stress: Not on file  Relationships  . Social connections:    Talks on phone: Not on file    Gets together: Not on file    Attends religious service: Not on file    Active member of club or organization: Not on file    Attends meetings of clubs or organizations: Not on file    Relationship status: Not on file  Other Topics Concern  . Not on file  Social History Narrative  . Not on file     Family History: The patient's family history includes Heart disease in her mother; Pulmonary fibrosis in her mother.  ROS:   Please see the history of present illness.     All other systems reviewed and are negative.  EKGs/Labs/Other Studies Reviewed:    The following studies were reviewed today:   EKG: July 10, 2018: Sinus bradycardia at 54 beats a minute.  No ST or T wave changes.  Recent Labs: 07/08/2018: ALT  20; BUN 18; Creatinine, Ser 0.79; Hemoglobin 12.6; Platelets 300; Potassium 3.5; Sodium 139  Recent Lipid Panel No results found for: CHOL, TRIG, HDL, CHOLHDL, VLDL, LDLCALC, LDLDIRECT  Physical Exam:    VS:  BP 110/70 (BP Location: Right Arm, Patient Position: Sitting, Cuff Size: Normal)   Pulse 64   Ht 5\' 7"  (1.702 m)   Wt 135 lb 12 oz (61.6 kg)   BMI 21.26 kg/m     Wt Readings from Last 3 Encounters:  07/26/18 135 lb 12 oz (61.6 kg)  07/08/18 132 lb (59.9 kg)  12/14/17 132 lb (59.9 kg)     GEN:  Well nourished, well developed in no acute distress HEENT: Normal NECK: No JVD; No carotid bruits LYMPHATICS: No lymphadenopathy CARDIAC: RRR, no murmurs, rubs, gallops.   She has rib tenderness along the 2-3rd ribs on left side.     RESPIRATORY:  Clear to auscultation without rales, wheezing or rhonchi  ABDOMEN: No abdominal aorta.  She has a soft abdominal bruit. MUSCULOSKELETAL:  No edema; No deformity  SKIN: Warm and dry NEUROLOGIC:  Alert and oriented x 3 PSYCHIATRIC:  Normal affect   ASSESSMENT:    1. Costochondritis   2. Abdominal bruit   3. Abdominal aortic pulsation    PLAN:    In order of problems listed above:  1. Costochondritis: Alexis Payne presents with left-sided chest pain.  She has rib tenderness that matches her presenting symptoms.  I suspect that she has costochondritis.  She has some arthritis at baseline.  I recommended that she take ibuprofen 400 mg 3 times a day for at least 5 to 7 days. I think this will help with her symptoms. I recommended that she start some light weights or doing yoga.  Stretching may help with some of this tenderness.  2.  Abdominal bruit with a pulsatile abdominal aorta: She is fairly thin and it is likely that this is a normal variant.  She does have a soft bruit.  We will get an abdominal aortic duplex scan.  I will see her in several months for follow up .    Medication Adjustments/Labs and Tests Ordered: Current medicines are  reviewed at length with the patient today.  Concerns regarding medicines are outlined above.  No orders of the defined types were placed in this encounter.  Meds ordered this encounter  Medications  . Ibuprofen 200 MG CAPS    Sig: Take 2 capsules (400 mg total) by mouth 3 (three) times daily  for 7 days. Then take 200-400 mg as needed, up to 3 times per day    Dispense:  120 each    Refill:  0     There are no Patient Instructions on file for this visit.   Signed, Mertie Moores, MD  07/26/2018 8:55 AM    [place]

## 2018-07-27 ENCOUNTER — Encounter: Payer: Self-pay | Admitting: Cardiovascular Disease

## 2018-07-27 DIAGNOSIS — F3342 Major depressive disorder, recurrent, in full remission: Secondary | ICD-10-CM | POA: Diagnosis not present

## 2018-07-27 DIAGNOSIS — Z0000 Encounter for general adult medical examination without abnormal findings: Secondary | ICD-10-CM | POA: Diagnosis not present

## 2018-07-27 DIAGNOSIS — R1012 Left upper quadrant pain: Secondary | ICD-10-CM | POA: Diagnosis not present

## 2018-07-28 ENCOUNTER — Ambulatory Visit (HOSPITAL_COMMUNITY): Payer: BLUE CROSS/BLUE SHIELD

## 2018-08-04 DIAGNOSIS — Z01411 Encounter for gynecological examination (general) (routine) with abnormal findings: Secondary | ICD-10-CM | POA: Diagnosis not present

## 2018-08-08 ENCOUNTER — Other Ambulatory Visit: Payer: Self-pay | Admitting: Rheumatology

## 2018-08-10 ENCOUNTER — Other Ambulatory Visit: Payer: Self-pay | Admitting: Obstetrics and Gynecology

## 2018-08-10 ENCOUNTER — Ambulatory Visit: Payer: BLUE CROSS/BLUE SHIELD | Admitting: Cardiology

## 2018-08-10 DIAGNOSIS — M810 Age-related osteoporosis without current pathological fracture: Secondary | ICD-10-CM

## 2018-08-16 ENCOUNTER — Other Ambulatory Visit: Payer: Self-pay | Admitting: Internal Medicine

## 2018-08-16 ENCOUNTER — Ambulatory Visit
Admission: RE | Admit: 2018-08-16 | Discharge: 2018-08-16 | Disposition: A | Discharge status: Home / Self Care | Payer: BLUE CROSS/BLUE SHIELD | Source: Ambulatory Visit | Attending: Internal Medicine | Admitting: Internal Medicine

## 2018-08-16 DIAGNOSIS — M67432 Ganglion, left wrist: Secondary | ICD-10-CM

## 2018-08-23 ENCOUNTER — Other Ambulatory Visit: Payer: Self-pay | Admitting: Rheumatology

## 2018-09-22 ENCOUNTER — Encounter: Payer: Self-pay | Admitting: Cardiovascular Disease

## 2018-09-28 ENCOUNTER — Telehealth: Payer: Self-pay

## 2018-09-28 NOTE — Telephone Encounter (Signed)
   Primary Cardiologist:  No primary care provider on file.   Patient contacted.  History reviewed.  No symptoms to suggest any unstable cardiac conditions.  Based on discussion, with current pandemic situation, we will be postponing this appointment for Alexis Payne with a plan for f/u in  wks or sooner if feasible/necessary.  If symptoms change, she has been instructed to contact our office.   Routing to C19 CANCEL pool for tracking (P CV DIV CV19 CANCEL - reason for visit "other.") and assigning priority (1 = 4-6 wks, 2 = 6-12 wks, 3 = >12 wks).   Jones Broom, CMA  09/28/2018 3:28 PM         .

## 2018-10-03 ENCOUNTER — Ambulatory Visit: Payer: BLUE CROSS/BLUE SHIELD | Admitting: Cardiovascular Disease

## 2018-10-06 ENCOUNTER — Other Ambulatory Visit: Payer: BLUE CROSS/BLUE SHIELD

## 2018-10-19 ENCOUNTER — Telehealth: Payer: Self-pay | Admitting: Cardiovascular Disease

## 2018-10-19 NOTE — Telephone Encounter (Signed)
Spoke to patient who decided to reschedule her March appointment with Dr. Acie Fredrickson to 02/06/2019.  I offered a virtual visit with Vin Bhagat but she declined.

## 2018-11-03 DIAGNOSIS — S80869A Insect bite (nonvenomous), unspecified lower leg, initial encounter: Secondary | ICD-10-CM | POA: Diagnosis not present

## 2019-02-06 ENCOUNTER — Ambulatory Visit: Payer: BLUE CROSS/BLUE SHIELD | Admitting: Cardiovascular Disease

## 2019-02-08 DIAGNOSIS — F3342 Major depressive disorder, recurrent, in full remission: Secondary | ICD-10-CM | POA: Diagnosis not present

## 2019-04-21 DIAGNOSIS — H2513 Age-related nuclear cataract, bilateral: Secondary | ICD-10-CM | POA: Diagnosis not present

## 2019-05-15 IMAGING — DX DG CHEST 2V
2 series · 2 of 2 positions shown · non-contrast
Comparison: 05/12/2016

CLINICAL DATA: Elevated D-dimer with left chest pain.

EXAM:
CHEST - 2 VIEW

[chest pa]
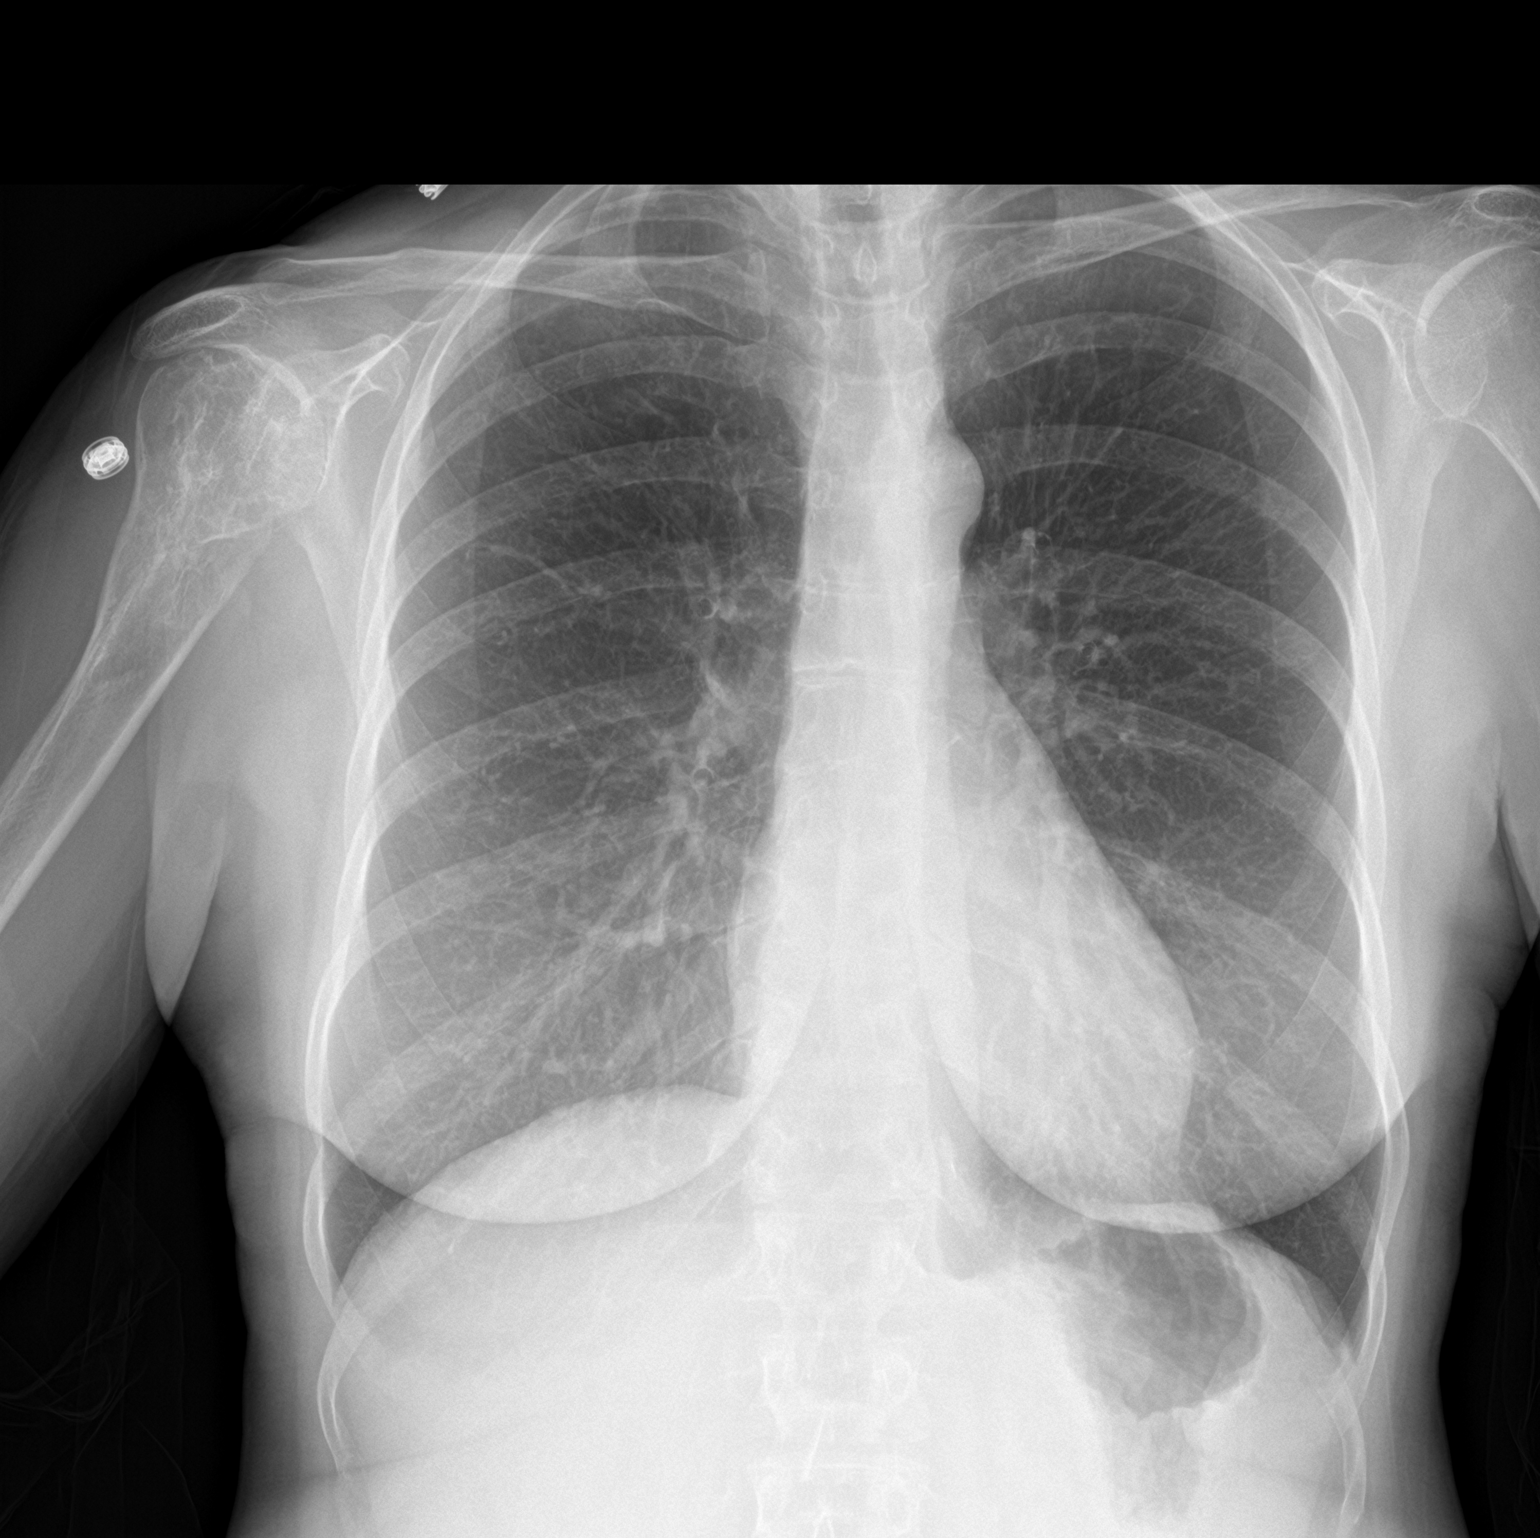

[chest lat]
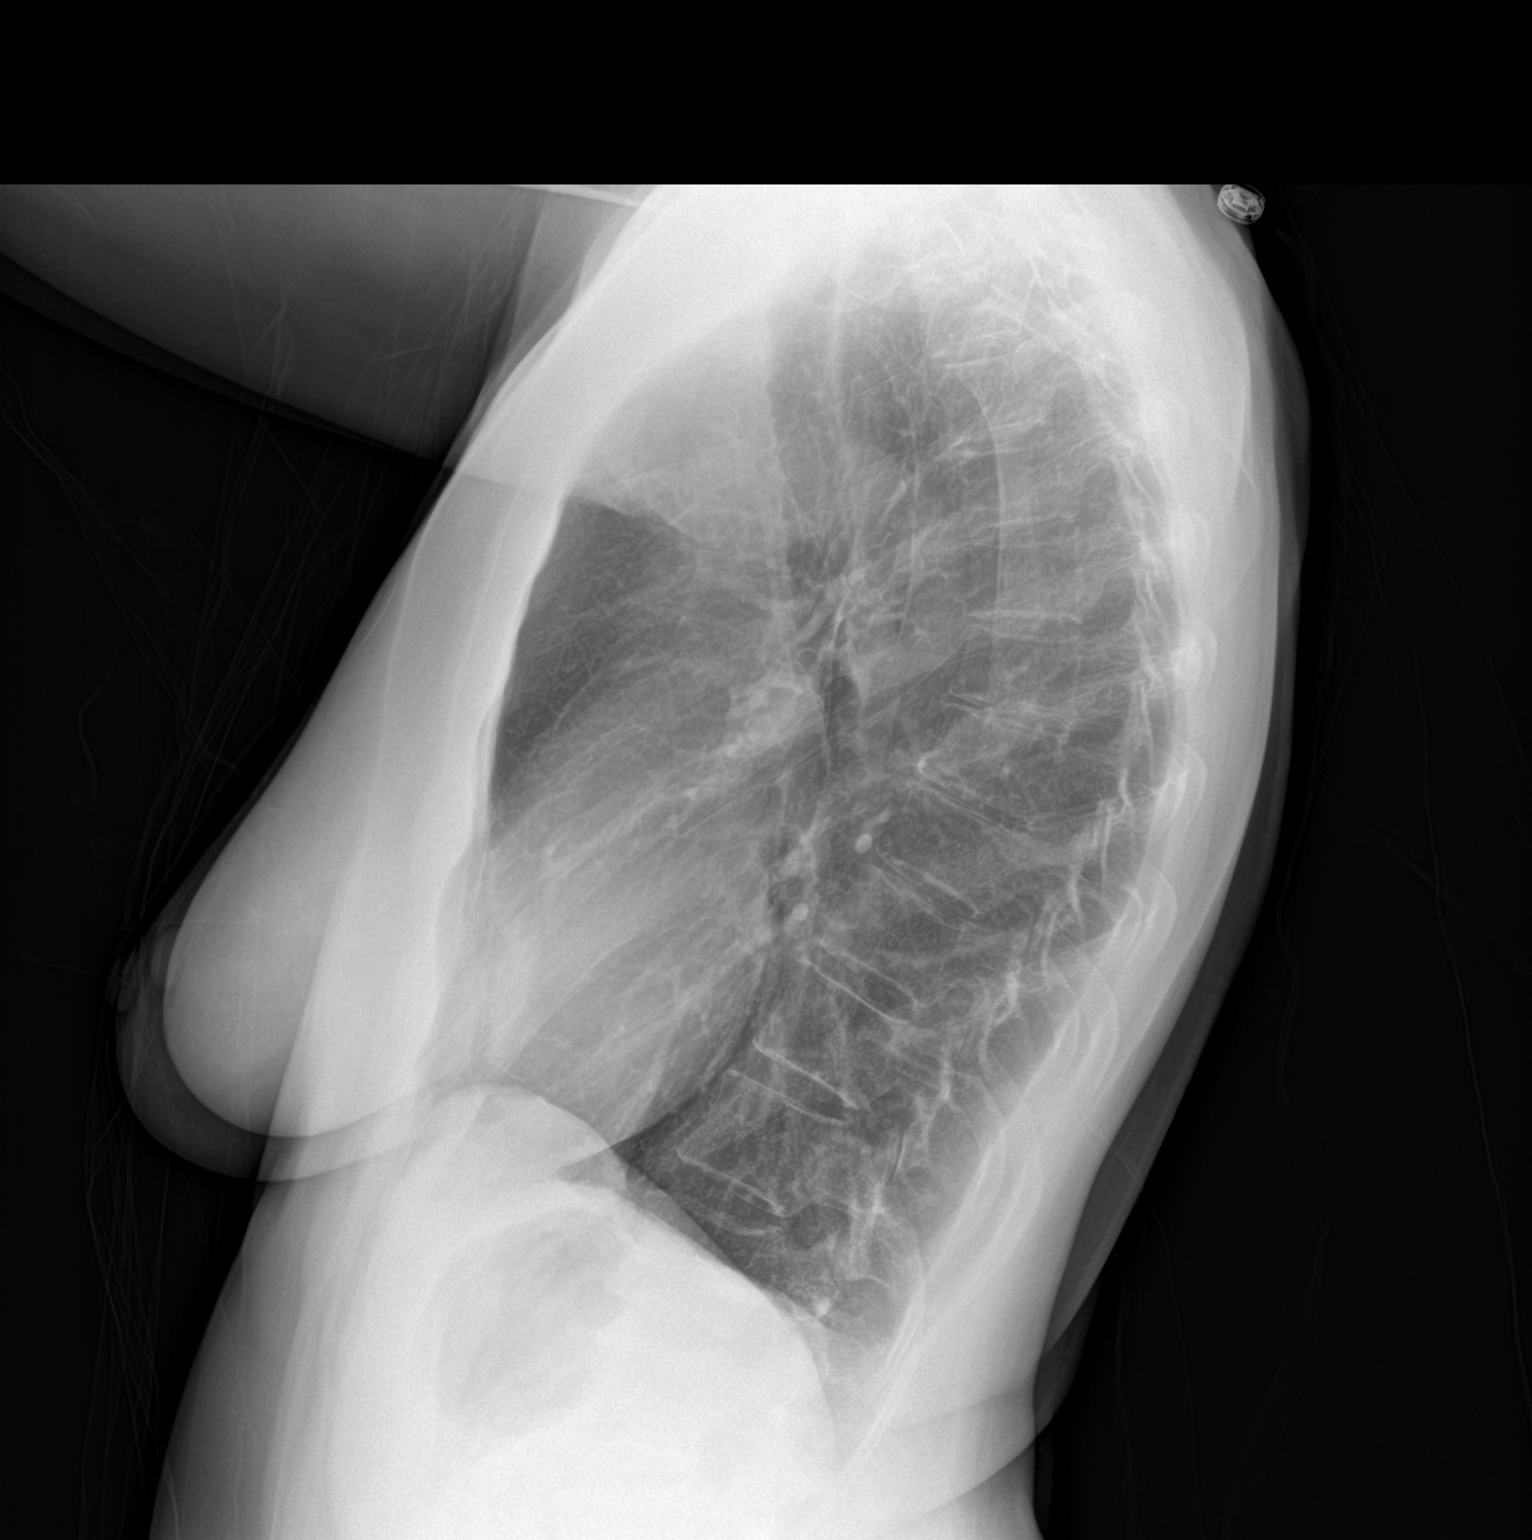

[2 of 2 positions shown; findings below may reference images not displayed]

FINDINGS: Lungs are clear. Cardiomediastinal silhouette and remainder the exam
is unchanged.
IMPRESSION: No active cardiopulmonary disease.

## 2019-05-15 IMAGING — CT CT ANGIO CHEST
2 of 6 series · 19 of 46 positions shown · IV contrast (iopamidol)
Comparison: Chest radiograph earlier this day, additional
radiographs reviewed.

CLINICAL DATA: PE suspected, intermediate prob, positive D-dimer.
Chest tightness and dyspnea.

EXAM:
CT ANGIOGRAPHY CHEST WITH CONTRAST
TECHNIQUE: Multidetector CT imaging of the chest was performed using the
standard protocol during bolus administration of intravenous
contrast. Multiplanar CT image reconstructions and MIPs were
obtained to evaluate the vascular anatomy.
CONTRAST:  100mL SMAPMQ-E79 IOPAMIDOL (SMAPMQ-E79) INJECTION 76%

[Series 5: thins · axial · 0.70mm/px · z∈[+1367,+1640]mm · 16 of 301 slices shown]
[im 14/301  lung]
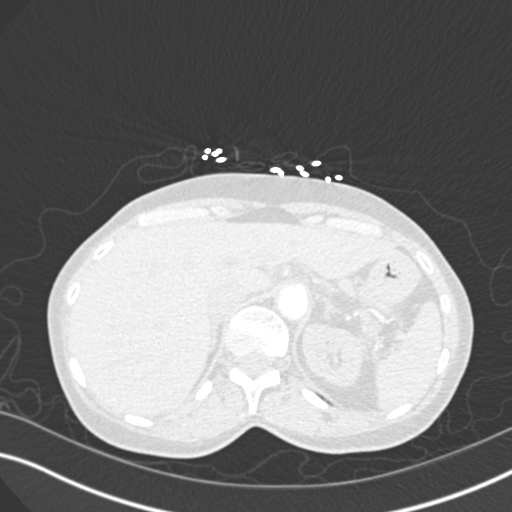
[im 40/301  soft-tissue]
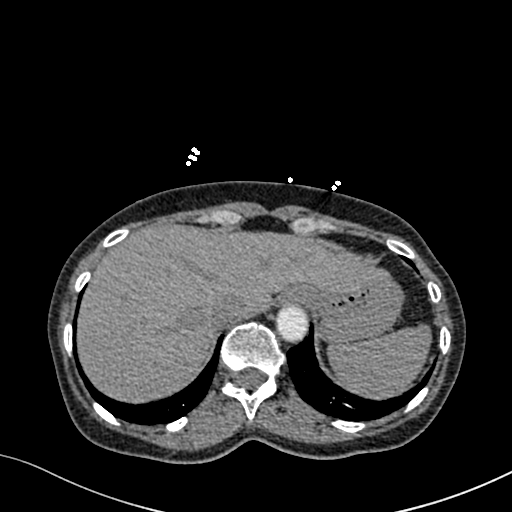
[im 53/301  lung]
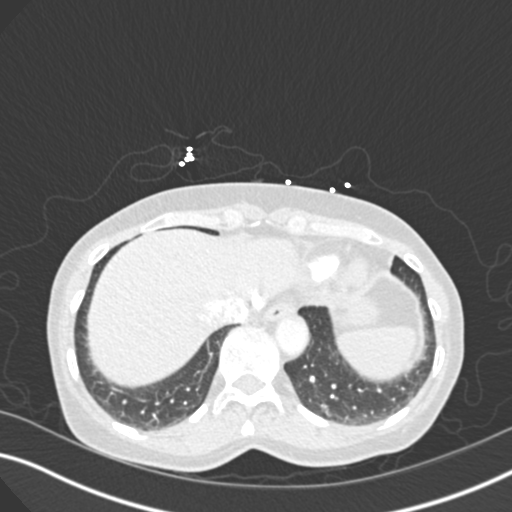
[im 66/301  soft-tissue]
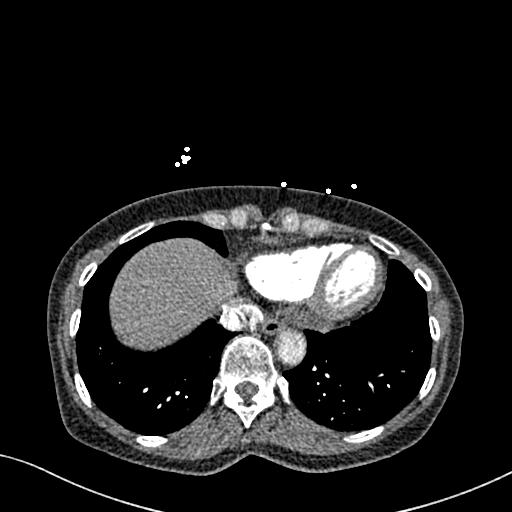
[im 92/301  lung]
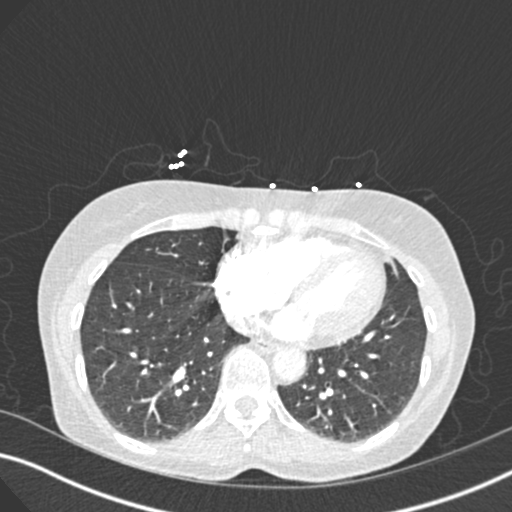
[im 105/301  soft-tissue]
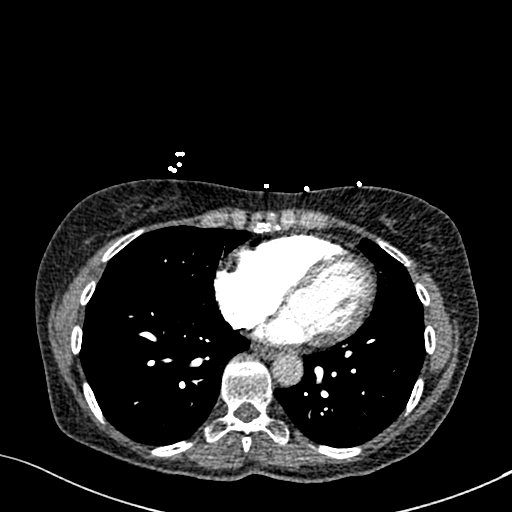
[im 118/301  lung]
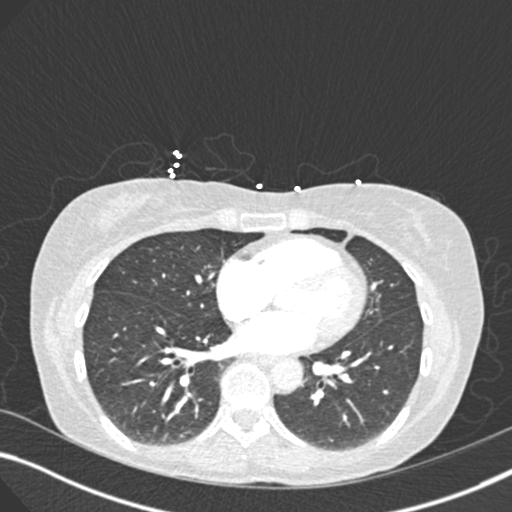
[im 144/301  soft-tissue]
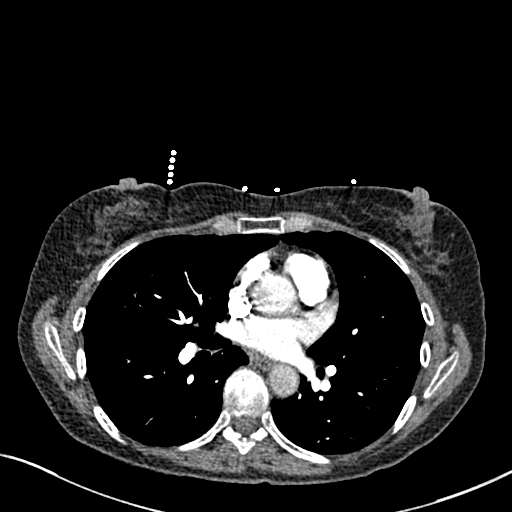
[im 157/301  lung]
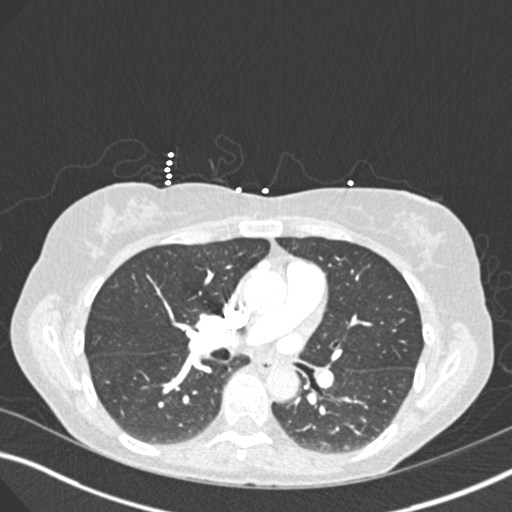
[im 183/301  soft-tissue]
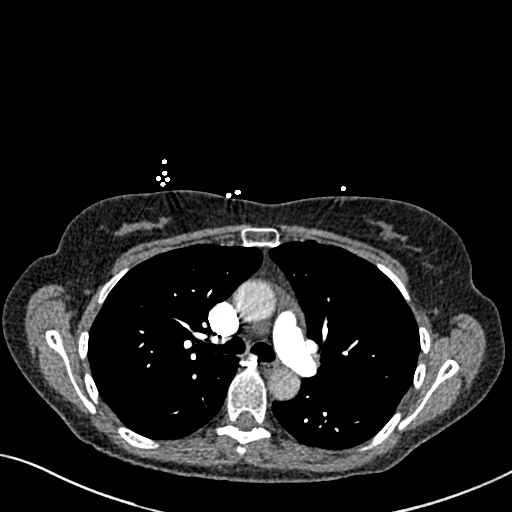
[im 196/301  lung]
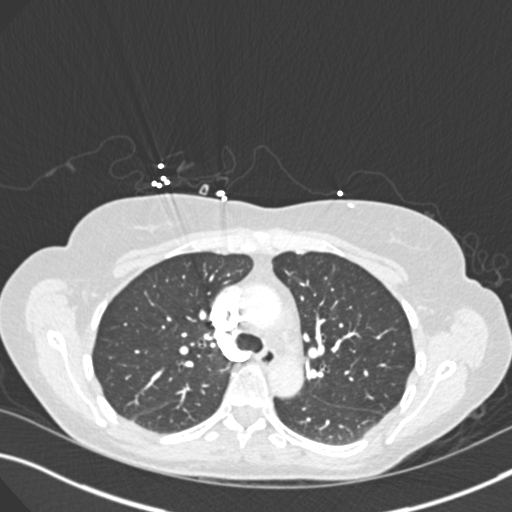
[im 209/301  soft-tissue]
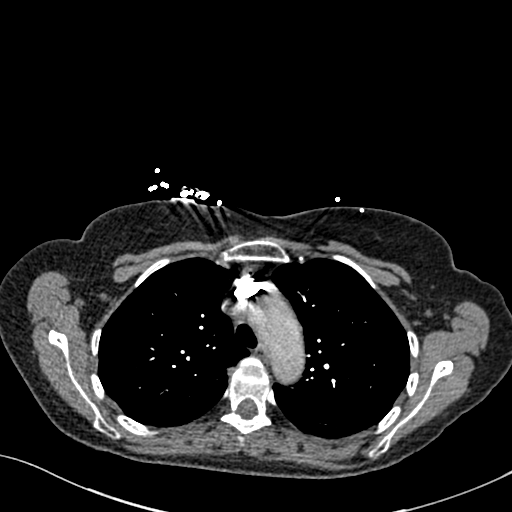
[im 235/301  lung]
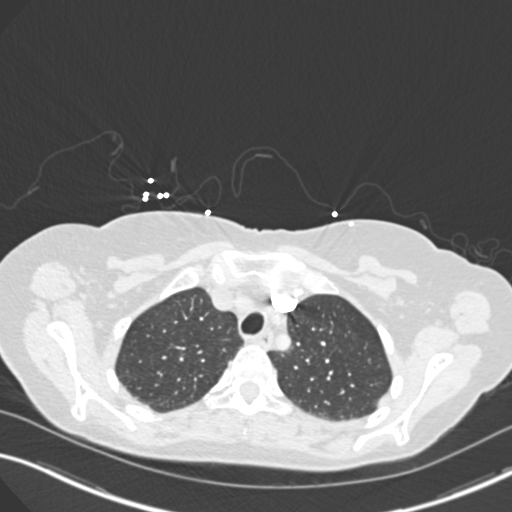
[im 248/301  soft-tissue]
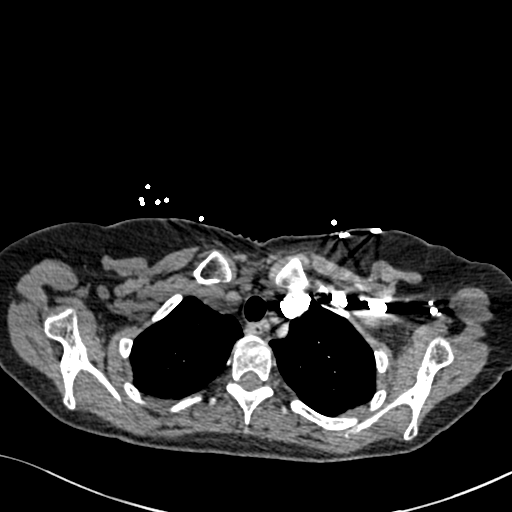
[im 261/301  lung]
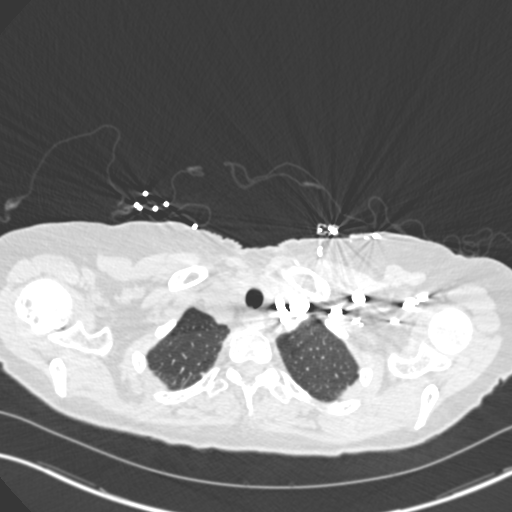
[im 287/301  soft-tissue]
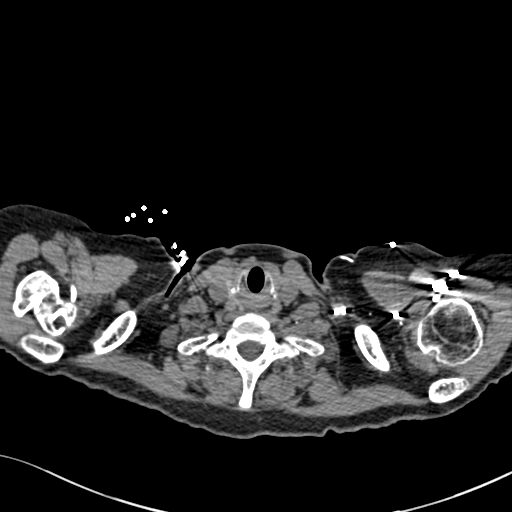

[Series 7: coronal mpr · coronal · 0.71mm/px · 3 of 151 slices shown]
[im 38/151  soft-tissue]
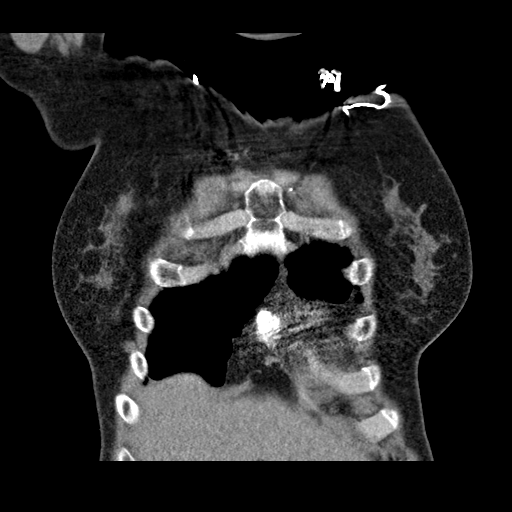
[im 76/151  soft-tissue]
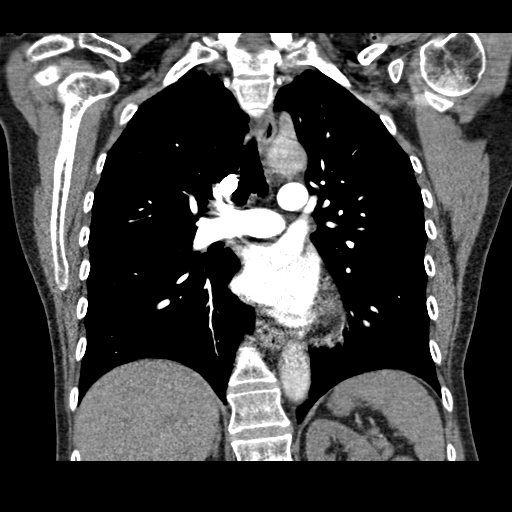
[im 113/151  soft-tissue]
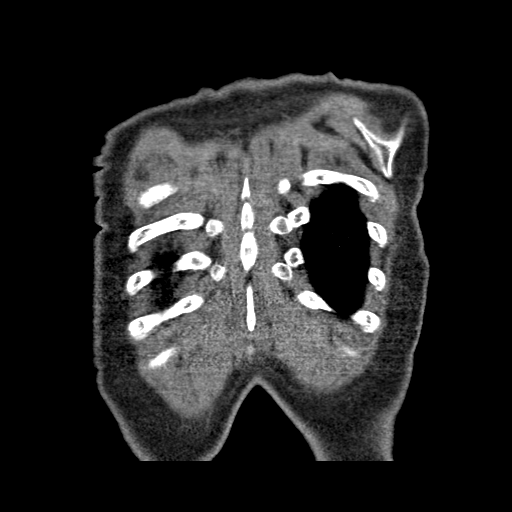

[19 of 46 positions shown; findings below may reference images not displayed]

FINDINGS: Cardiovascular: There are no filling defects within the pulmonary
arteries to suggest pulmonary embolus. The thoracic aorta is normal
in caliber, limited assessment for dissection as examination is
tailored for pulmonary embolus evaluation. The heart is normal in
size. No pericardial effusion.

Mediastinum/Nodes: No enlarged mediastinal or hilar lymph nodes. The
esophagus is decompressed. No visualized thyroid nodule.

Lungs/Pleura: Minor hypoventilatory atelectasis at the bases. No
confluent airspace disease. No pulmonary edema or pleural fluid. No
pulmonary mass. The trachea and mainstem bronchi are patent.

Upper Abdomen: No acute abnormality.

Musculoskeletal: There are no acute or suspicious osseous
abnormalities. Irregularity of the right proximal humerus is chronic
and similar to prior exams.

Review of the MIP images confirms the above findings.
IMPRESSION: No pulmonary embolus or acute intrathoracic abnormality.

## 2019-05-19 IMAGING — CT CT ABD-PELV W/ CM
2 of 5 series · 12 of 46 positions shown, 14 images · IV contrast (omnipaque)
Comparison: CT chest 07/08/2018.  Abdominal sonogram 06/27/2018.

CLINICAL DATA: 62-year-old female with left flank and left lower
quadrant pain for several months. Subsequent encounter.

EXAM:
CT ABDOMEN AND PELVIS WITH CONTRAST
TECHNIQUE: Multidetector CT imaging of the abdomen and pelvis was performed
using the standard protocol following bolus administration of
intravenous contrast.
CONTRAST:  100mL OMNIPAQUE IOHEXOL 300 MG/ML  SOLN

[Series 2: abd pelvis 5.00 br40 s3 ax · axial · 0.45mm/px · z∈[+1114,+1434]mm · 9 of 80 slices shown, 11 images]
[im 8/80  soft-tissue]
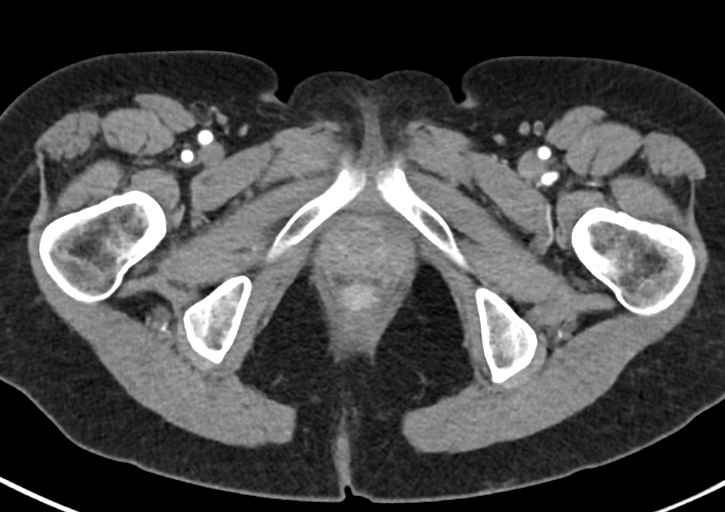
[im 8/80  bone]
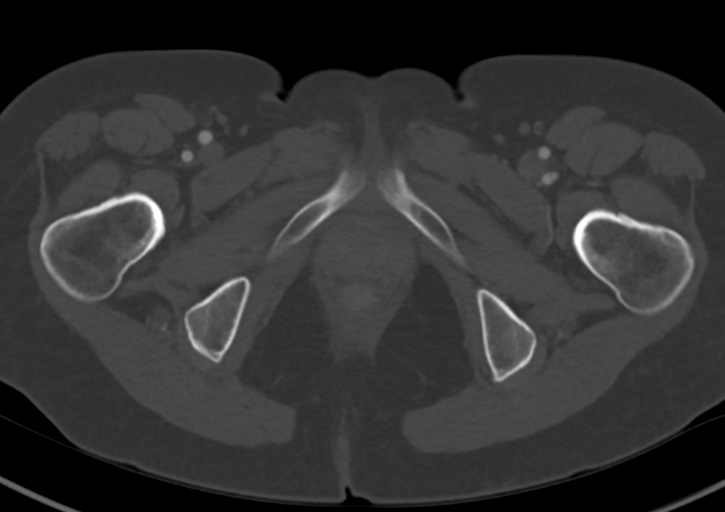
[im 16/80  soft-tissue]
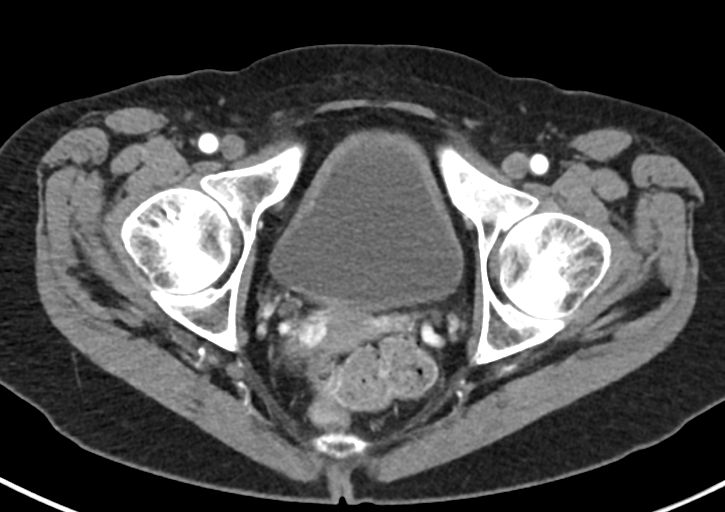
[im 24/80  soft-tissue]
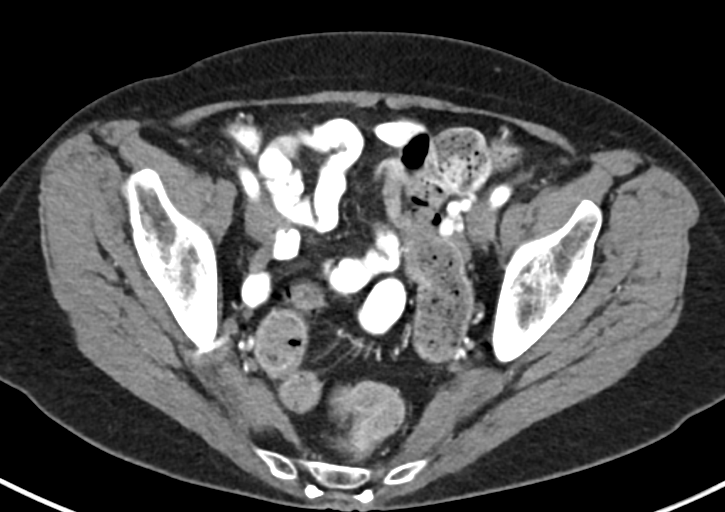
[im 32/80  soft-tissue]
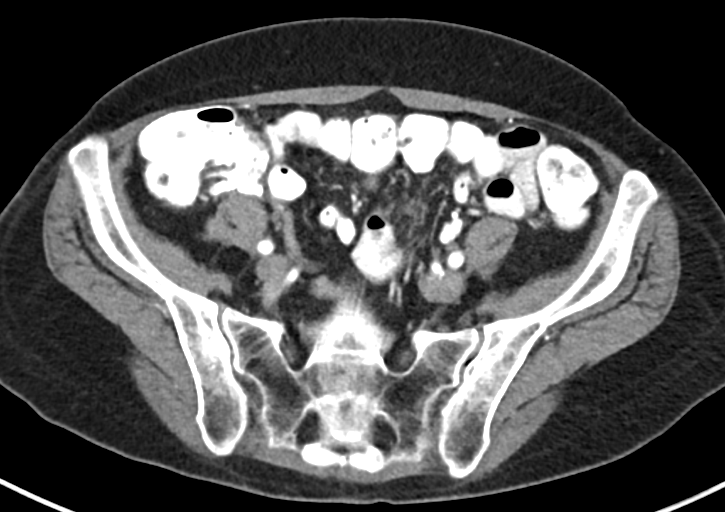
[im 40/80  soft-tissue]
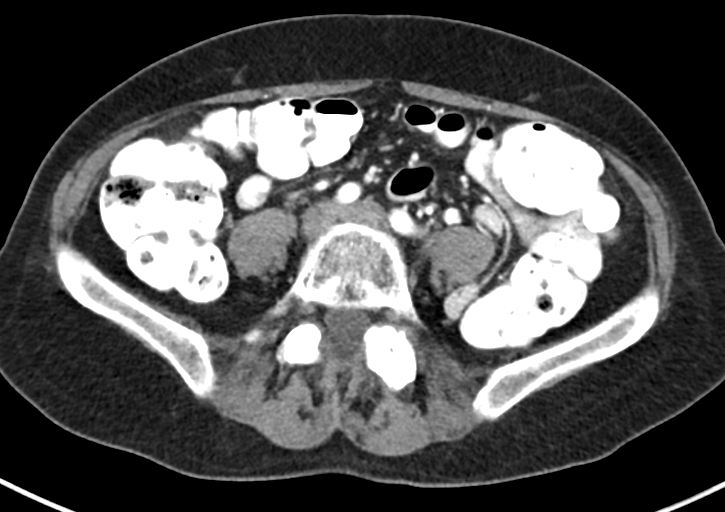
[im 48/80  soft-tissue]
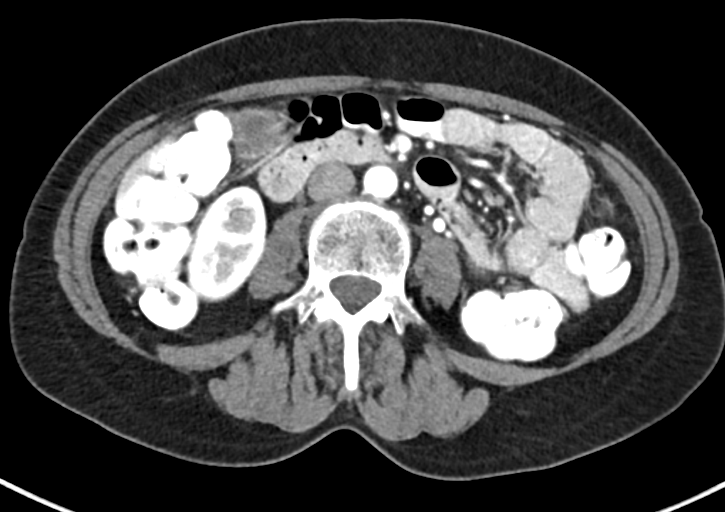
[im 56/80  soft-tissue]
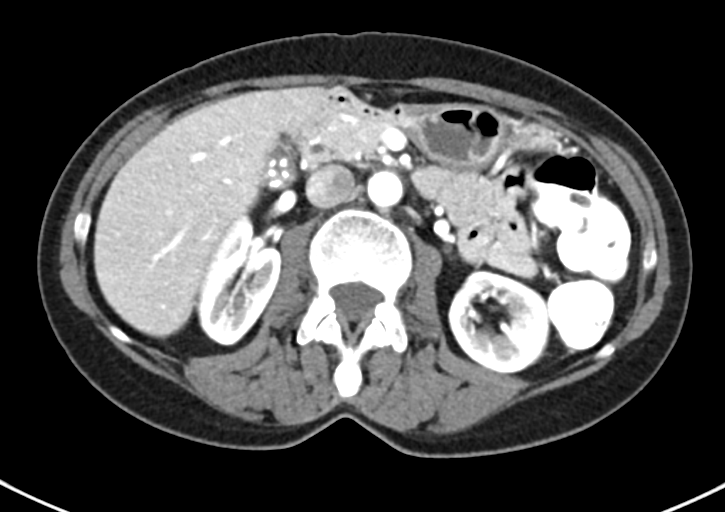
[im 64/80  soft-tissue]
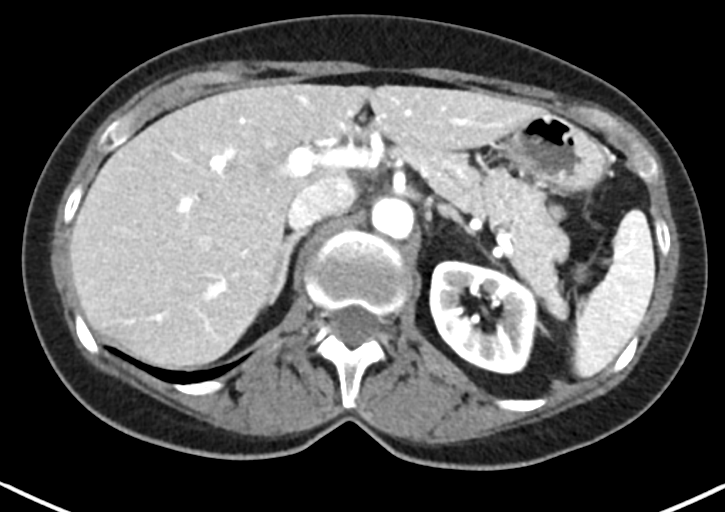
[im 72/80  soft-tissue]
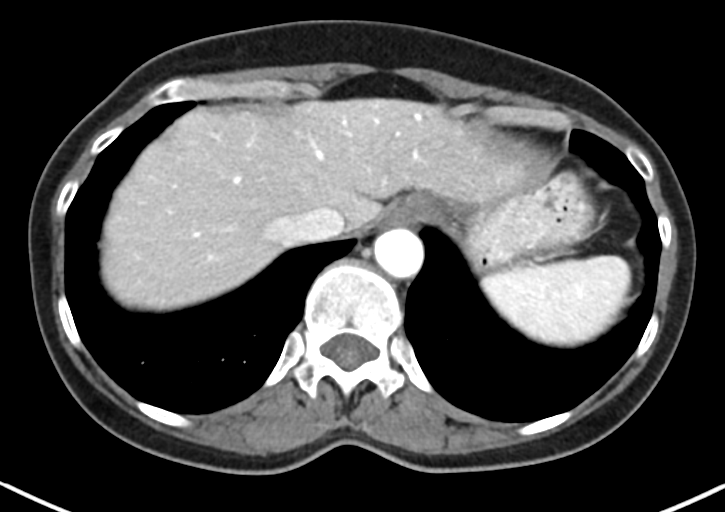
[im 72/80  bone]
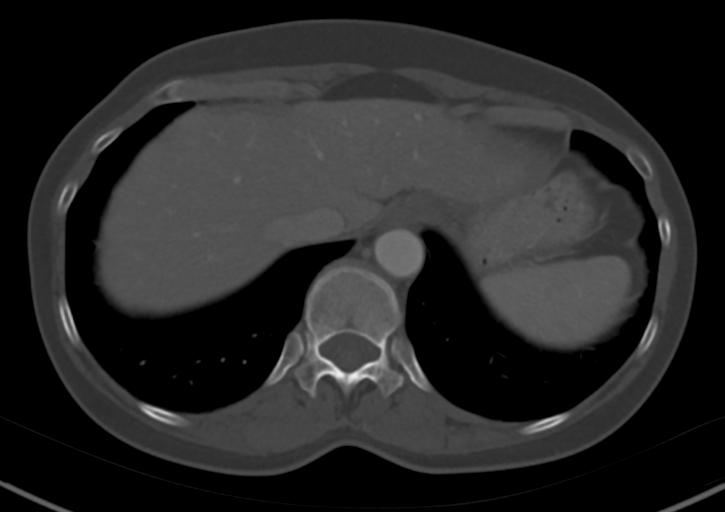

[Series 7: abd pelvis 2.00 br40 s3 cor · coronal · 0.64mm/px · 3 of 116 slices shown]
[im 39/116  soft-tissue]
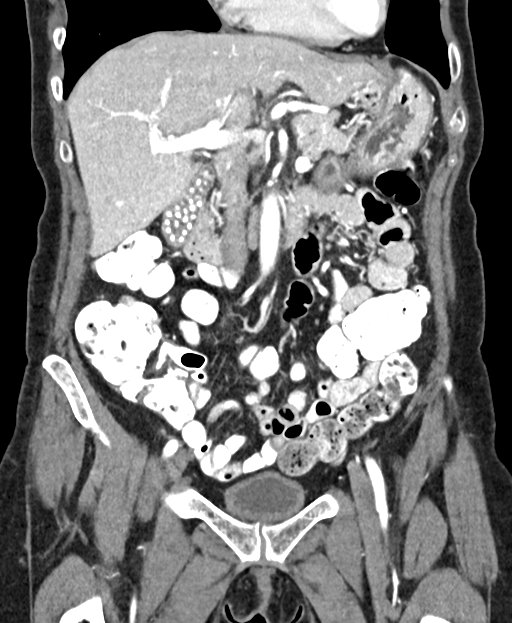
[im 52/116  soft-tissue]
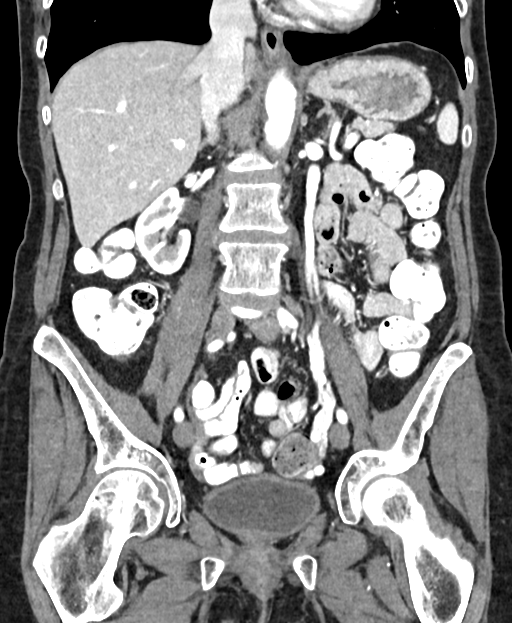
[im 64/116  soft-tissue]
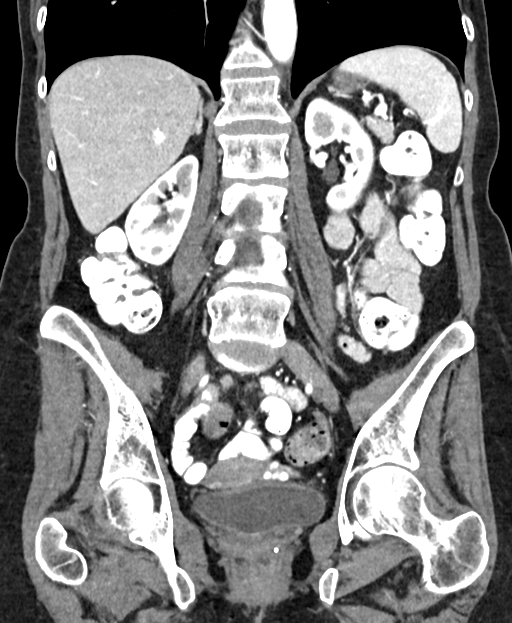

[12 of 46 positions shown; findings below may reference images not displayed]

FINDINGS: Lower chest: Minimal basilar scarring/atelectasis. Base of heart
unremarkable.

Hepatobiliary: Minimally lobular contour anterior aspect of the
liver may be normal as there are no other findings to suggest
cirrhosis. No worrisome hepatic lesion.

Multiple gallstones without CT evidence of gallbladder inflammation.
No common bile duct stone noted.

Pancreas: No worrisome hepatic mass or inflammation.

Spleen: No splenic mass or enlargement.

Adrenals/Urinary Tract: No obstructing stone or hydronephrosis. No
worrisome renal or adrenal mass.

Partially contracted urinary bladder with mild circumferential wall
thickening. Noncontrast filled images without gross abnormality
identified.

Stomach/Bowel: Few scattered colonic diverticula without evidence of
extraluminal bowel inflammatory process. Portions of the stomach,
small bowel and colon are under distended or stool filled limiting
evaluation for detection of a mass. No obvious mass noted.

Vascular/Lymphatic: No abdominal aortic aneurysm or large vessel
occlusion. Narrowing proximal celiac artery.

Scattered normal size lymph nodes.

Reproductive: Prominent left gonadal vein and pelvic vessels.
Slightly prominent right pelvic vasculature. In the proper clinical
setting, this could represent changes of pelvic venous congestion
syndrome. No worrisome adnexal mass or uterine mass noted.

Other: No free air or bowel containing hernia.

Musculoskeletal: Moderate left L5-S1 facet degenerative changes.
IMPRESSION: 1. Prominent left gonadal vein and pelvic vessels. Slightly
prominent right pelvic vasculature. In the proper clinical setting,
this could represent changes of pelvic venous congestion syndrome.
2. Scattered colonic diverticula without evidence of diverticulitis.
3. Gallstones.
4. Narrowing proximal celiac artery. This may be partially explained
by arcuate ligament.

## 2019-05-20 DIAGNOSIS — Z23 Encounter for immunization: Secondary | ICD-10-CM | POA: Diagnosis not present

## 2019-09-05 ENCOUNTER — Other Ambulatory Visit: Payer: Self-pay | Admitting: Obstetrics and Gynecology

## 2019-09-05 DIAGNOSIS — Z1231 Encounter for screening mammogram for malignant neoplasm of breast: Secondary | ICD-10-CM

## 2019-09-05 DIAGNOSIS — M8580 Other specified disorders of bone density and structure, unspecified site: Secondary | ICD-10-CM

## 2019-09-08 ENCOUNTER — Ambulatory Visit: Payer: 59 | Attending: Internal Medicine

## 2019-09-08 DIAGNOSIS — Z23 Encounter for immunization: Secondary | ICD-10-CM

## 2019-09-08 NOTE — Progress Notes (Signed)
   Covid-19 Vaccination Clinic  Name:  Alexis Payne    MRN: [id number] DOB: 16-Jun-1956  09/08/2019  Alexis Payne was observed post Covid-19 immunization for 30 minutes based on pre-vaccination screening without incident. She was provided with Vaccine Information Sheet and instruction to access the V-Safe system.   Alexis Payne was instructed to call 911 with any severe reactions post vaccine: Marland Kitchen Difficulty breathing  . Swelling of face and throat  . A fast heartbeat  . A bad rash all over body  . Dizziness and weakness   Immunizations Administered    Name Date Dose VIS Date Route   Pfizer COVID-19 Vaccine 09/08/2019  6:29 PM 0.3 mL 06/16/2019 Intramuscular   Manufacturer: [place]   Lot: [id number]   [place]: [id number]

## 2019-10-10 ENCOUNTER — Ambulatory Visit: Payer: 59 | Attending: Internal Medicine

## 2019-10-10 DIAGNOSIS — Z23 Encounter for immunization: Secondary | ICD-10-CM

## 2019-10-10 NOTE — Progress Notes (Signed)
   Covid-19 Vaccination Clinic  Name:  Alexis Payne    MRN: [id number] DOB: August 01, 1955  10/10/2019  [name] was observed post Covid-19 immunization for 30 minutes based on pre-vaccination screening without incident. She was provided with Vaccine Information Sheet and instruction to access the V-Safe system.   [name] was instructed to call 911 with any severe reactions post vaccine: Marland Kitchen Difficulty breathing  . Swelling of face and throat  . A fast heartbeat  . A bad rash all over body  . Dizziness and weakness   Immunizations Administered    Name Date Dose VIS Date Route   Pfizer COVID-19 Vaccine 10/10/2019  9:15 AM 0.3 mL 06/16/2019 Intramuscular   Manufacturer: [employer]   Lot: [id number]   [place]: [id number]

## 2019-11-20 ENCOUNTER — Other Ambulatory Visit: Payer: Self-pay

## 2019-11-20 ENCOUNTER — Ambulatory Visit
Admission: RE | Admit: 2019-11-20 | Discharge: 2019-11-20 | Disposition: A | Discharge status: Home / Self Care | Payer: 59 | Source: Ambulatory Visit | Attending: Obstetrics and Gynecology | Admitting: Obstetrics and Gynecology

## 2019-11-20 ENCOUNTER — Ambulatory Visit
Admission: RE | Admit: 2019-11-20 | Discharge: 2019-11-20 | Disposition: A | Discharge status: Home / Self Care | Payer: BLUE CROSS/BLUE SHIELD | Source: Ambulatory Visit | Attending: Obstetrics and Gynecology | Admitting: Obstetrics and Gynecology

## 2019-11-20 DIAGNOSIS — M8580 Other specified disorders of bone density and structure, unspecified site: Secondary | ICD-10-CM

## 2019-11-20 DIAGNOSIS — Z1231 Encounter for screening mammogram for malignant neoplasm of breast: Secondary | ICD-10-CM

## 2020-12-06 NOTE — Progress Notes (Deleted)
Office Visit Note  Patient: Alexis Payne             Date of Birth: 1955/12/20           MRN: [id number]             PCP: Leeroy Cha, MD Referring: Leeroy Cha,* Visit Date: 12/09/2020 Occupation: @GUAROCC @  Subjective:  No chief complaint on file.   History of Present Illness: Alexis Payne is a 65 y.o. female ***   Activities of Daily Living:  Patient reports morning stiffness for *** {minute/hour:19697}.   Patient {ACTIONS;DENIES/REPORTS:21021675::"Denies"} nocturnal pain.  Difficulty dressing/grooming: {ACTIONS;DENIES/REPORTS:21021675::"Denies"} Difficulty climbing stairs: {ACTIONS;DENIES/REPORTS:21021675::"Denies"} Difficulty getting out of chair: {ACTIONS;DENIES/REPORTS:21021675::"Denies"} Difficulty using hands for taps, buttons, cutlery, and/or writing: {ACTIONS;DENIES/REPORTS:21021675::"Denies"}  No Rheumatology ROS completed.   PMFS History:  There are no problems to display for this patient.   Past Medical History:  Diagnosis Date  . Reactive arthritis (HCC)     Family History  Problem Relation Age of Onset  . Pulmonary fibrosis Mother   . Heart disease Mother    Past Surgical History:  Procedure Laterality Date  . BREAST EXCISIONAL BIOPSY Right 1980   benign  . COLONOSCOPY WITH PROPOFOL N/A 08/31/2016   Procedure: COLONOSCOPY WITH PROPOFOL;  Surgeon: Garlan Fair, MD;  Location: [facility];  Service: Endoscopy;  Laterality: N/A;  . FRACTURE SURGERY     right shoulder  . HUMERUS SURGERY     Social History   Social History Narrative  . Not on file   Immunization History  Administered Date(s) Administered  . PFIZER(Purple Top)SARS-COV-2 Vaccination 09/08/2019, 10/10/2019     Objective: Vital Signs: There were no vitals taken for this visit.   Physical Exam   Musculoskeletal Exam: ***  CDAI Exam: CDAI Score: -- Patient Global: --; Provider Global: -- Swollen: --; Tender: -- Joint Exam 12/09/2020   No  joint exam has been documented for this visit   There is currently no information documented on the homunculus. Go to the Rheumatology activity and complete the homunculus joint exam.  Investigation: No additional findings.  Imaging: No results found.  Recent Labs: Lab Results  Component Value Date   WBC 5.8 07/08/2018   HGB 12.6 07/08/2018   PLT 300 07/08/2018   NA 139 07/08/2018   K 3.5 07/08/2018   CL 103 07/08/2018   CO2 26 07/08/2018   GLUCOSE 84 07/08/2018   BUN 18 07/08/2018   CREATININE 0.79 07/08/2018   BILITOT 0.5 07/08/2018   ALKPHOS 59 07/08/2018   AST 22 07/08/2018   ALT 20 07/08/2018   PROT 7.6 07/08/2018   ALBUMIN 4.4 07/08/2018   CALCIUM 9.5 07/08/2018   GFRAA >60 07/08/2018   QFTBGOLDPLUS NEGATIVE 11/16/2017    Speciality Comments: No specialty comments available.  Procedures:  No procedures performed Allergies: Wellbutrin [bupropion]   Assessment / Plan:     Visit Diagnoses: No diagnosis found.  Orders: No orders of the defined types were placed in this encounter.  No orders of the defined types were placed in this encounter.   Face-to-face time spent with patient was *** minutes. Greater than 50% of time was spent in counseling and coordination of care.  Follow-Up Instructions: No follow-ups on file.   Earnestine Mealing, CMA  Note - This record has been created using Editor, commissioning.  Chart creation errors have been sought, but may not always  have been located. Such creation errors do not reflect on  the standard of medical care.

## 2020-12-09 ENCOUNTER — Ambulatory Visit: Payer: 59 | Admitting: Rheumatology

## 2020-12-09 DIAGNOSIS — M0230 Reiter's disease, unspecified site: Secondary | ICD-10-CM

## 2020-12-09 DIAGNOSIS — Z8659 Personal history of other mental and behavioral disorders: Secondary | ICD-10-CM

## 2020-12-09 DIAGNOSIS — I7300 Raynaud's syndrome without gangrene: Secondary | ICD-10-CM

## 2020-12-09 DIAGNOSIS — H20029 Recurrent acute iridocyclitis, unspecified eye: Secondary | ICD-10-CM

## 2020-12-09 DIAGNOSIS — Z79899 Other long term (current) drug therapy: Secondary | ICD-10-CM

## 2021-01-02 ENCOUNTER — Other Ambulatory Visit: Payer: Self-pay | Admitting: Obstetrics and Gynecology

## 2021-01-02 DIAGNOSIS — Z1231 Encounter for screening mammogram for malignant neoplasm of breast: Secondary | ICD-10-CM

## 2021-02-25 ENCOUNTER — Other Ambulatory Visit: Payer: Self-pay

## 2021-02-25 ENCOUNTER — Ambulatory Visit
Admission: RE | Admit: 2021-02-25 | Discharge: 2021-02-25 | Disposition: A | Discharge status: Home / Self Care | Payer: Medicare Other | Source: Ambulatory Visit | Attending: Obstetrics and Gynecology | Admitting: Obstetrics and Gynecology

## 2021-02-25 DIAGNOSIS — Z1231 Encounter for screening mammogram for malignant neoplasm of breast: Secondary | ICD-10-CM

## 2021-04-23 ENCOUNTER — Other Ambulatory Visit: Payer: Self-pay

## 2021-04-23 ENCOUNTER — Ambulatory Visit (INDEPENDENT_AMBULATORY_CARE_PROVIDER_SITE_OTHER): Payer: Medicare Other | Admitting: Rheumatology

## 2021-04-23 ENCOUNTER — Encounter: Payer: Self-pay | Admitting: Rheumatology

## 2021-04-23 VITALS — BP 123/78 | HR 76 | Ht 67.0 in | Wt 136.0 lb

## 2021-04-23 DIAGNOSIS — M25511 Pain in right shoulder: Secondary | ICD-10-CM | POA: Diagnosis not present

## 2021-04-23 DIAGNOSIS — M545 Low back pain, unspecified: Secondary | ICD-10-CM

## 2021-04-23 DIAGNOSIS — M722 Plantar fascial fibromatosis: Secondary | ICD-10-CM

## 2021-04-23 DIAGNOSIS — Z8659 Personal history of other mental and behavioral disorders: Secondary | ICD-10-CM

## 2021-04-23 DIAGNOSIS — G8929 Other chronic pain: Secondary | ICD-10-CM

## 2021-04-23 DIAGNOSIS — M0230 Reiter's disease, unspecified site: Secondary | ICD-10-CM

## 2021-04-23 DIAGNOSIS — M25562 Pain in left knee: Secondary | ICD-10-CM

## 2021-04-23 DIAGNOSIS — I7300 Raynaud's syndrome without gangrene: Secondary | ICD-10-CM

## 2021-04-23 DIAGNOSIS — H20029 Recurrent acute iridocyclitis, unspecified eye: Secondary | ICD-10-CM | POA: Insufficient documentation

## 2021-04-23 NOTE — Progress Notes (Signed)
Office Visit Note  Patient: Alexis Payne             Date of Birth: 10-02-1955           MRN: [id number]             PCP: Leeroy Cha, MD Referring: Leeroy Cha,* Visit Date: 04/23/2021 Occupation: _0 @  Subjective:  Other (Left knee pain, left foot pain and right shoulder pain. Patient also reports back pain. )   History of Present Illness: Alexis Payne is a 65 y.o. female returns today after her last visit of June 2019.  She had the diagnosis of reactive arthritis and recurrent iritis.  She was treated with methotrexate in the past.  When she was seen in June 2019 due to recurrent flares and use of topical steroids she was given the prescription for methotrexate.  She had initial prescription of methotrexate but did not have any further refills and discontinued the medication.  She states that she had mild iritis flares since 2019.  In April 2022 she started having pain in her left knee joint with some warmth.  In May 2022 she fell and injured her right shoulder joint.  After that she developed a flare of iritis which lasted for almost 2 months.  Since then she has been experiencing increased pain in her right shoulder, left knee and her left plantar fascia.  She is occasional discomfort in her right elbow.  After the fall she started experiencing lower back pain.  She was evaluated by Dr. Alma Payne who suggested use of heating pad.  According to the patient he felt the symptoms were related to muscle spasm.  The last episode iritis flare was in August 2022.  She is followed by Dr. Alain Payne.  Raynaud's not very active currently.  Although she does get more symptoms during the winter months.  Activities of Daily Living:  Patient reports morning stiffness for 15-20 minutes.   Patient Denies nocturnal pain.  Difficulty dressing/grooming: Denies Difficulty climbing stairs: Denies Difficulty getting out of chair: Denies Difficulty using hands for taps,  buttons, cutlery, and/or writing: Denies  Review of Systems  Constitutional:  Positive for fatigue.  HENT:  Negative for mouth sores, mouth dryness and nose dryness.   Eyes:  Negative for pain, itching and dryness.  Respiratory:  Negative for shortness of breath and difficulty breathing.   Cardiovascular:  Negative for chest pain and palpitations.  Gastrointestinal:  Negative for blood in stool, constipation and diarrhea.  Endocrine: Negative for increased urination.  Genitourinary:  Negative for difficulty urinating.  Musculoskeletal:  Positive for joint pain, joint pain, myalgias, morning stiffness, muscle tenderness and myalgias. Negative for joint swelling.  Skin:  Positive for color change. Negative for rash, redness and sensitivity to sunlight.  Allergic/Immunologic: Negative for susceptible to infections.  Neurological:  Positive for numbness. Negative for dizziness, headaches, memory loss and weakness.  Hematological:  Negative for bruising/bleeding tendency and swollen glands.  Psychiatric/Behavioral:  Negative for depressed mood, confusion and sleep disturbance. The patient is not nervous/anxious.    PMFS History:  Patient Active Problem List   Diagnosis Date Noted   Recurrent iritis 04/23/2021     Past Medical History:  Diagnosis Date   Reactive arthritis ([place])     Family History  Problem Relation Age of Onset   Pulmonary fibrosis Mother    Heart disease Mother    Past Surgical History:  Procedure Laterality Date   BREAST EXCISIONAL BIOPSY Right 1980  benign   COLONOSCOPY WITH PROPOFOL N/A 08/31/2016   Procedure: COLONOSCOPY WITH PROPOFOL;  Surgeon: Garlan Fair, MD;  Location: [facility];  Service: Endoscopy;  Laterality: N/A;   FRACTURE SURGERY     right shoulder   HUMERUS SURGERY     Social History   Social History Narrative   Not on file   Immunization History  Administered Date(s) Administered   PFIZER(Purple Top)SARS-COV-2 Vaccination  09/08/2019, 10/10/2019     Objective: Vital Signs: BP 123/78 (BP Location: Left Arm, Patient Position: Sitting, Cuff Size: Normal)   Pulse 76   Ht _0  (1.702 m)   Wt 136 lb (61.7 kg)   BMI 21.30 kg/m    Physical Exam Vitals and nursing note reviewed.  Constitutional:      Appearance: She is well-developed.  HENT:     Head: Normocephalic and atraumatic.  Eyes:     Conjunctiva/sclera: Conjunctivae normal.  Cardiovascular:     Rate and Rhythm: Normal rate and regular rhythm.     Heart sounds: Normal heart sounds.  Pulmonary:     Effort: Pulmonary effort is normal.     Breath sounds: Normal breath sounds.  Abdominal:     General: Bowel sounds are normal.     Palpations: Abdomen is soft.  Musculoskeletal:     Cervical back: Normal range of motion.  Lymphadenopathy:     Cervical: No cervical adenopathy.  Skin:    General: Skin is warm and dry.     Capillary Refill: Capillary refill takes less than 2 seconds.  Neurological:     Mental Status: She is alert and oriented to person, place, and time.  Psychiatric:        Behavior: Behavior normal.     Musculoskeletal Exam: C-spine was in good range of motion.  Shoulder joints, elbow joints, wrist joints, MCPs PIPs and DIPs with good range of motion with mild synovitis.  Hip joints, knee joints with good range of motion with no warmth swelling or effusion.  She had some MTP PIP and DIP prominence in her feet consistent with osteoarthritis.  She had tenderness on palpation of her left heel.  CDAI Exam: CDAI Score: -- Patient Global: --; Provider Global: -- Swollen: --; Tender: -- Joint Exam 04/23/2021   No joint exam has been documented for this visit   There is currently no information documented on the homunculus. Go to the Rheumatology activity and complete the homunculus joint exam.  Investigation: No additional findings.  Imaging: No results found.  Recent Labs: Lab Results  Component Value Date   WBC 5.8  07/08/2018   HGB 12.6 07/08/2018   PLT 300 07/08/2018   NA 139 07/08/2018   K 3.5 07/08/2018   CL 103 07/08/2018   CO2 26 07/08/2018   GLUCOSE 84 07/08/2018   BUN 18 07/08/2018   CREATININE 0.79 07/08/2018   BILITOT 0.5 07/08/2018   ALKPHOS 59 07/08/2018   AST 22 07/08/2018   ALT 20 07/08/2018   PROT 7.6 07/08/2018   ALBUMIN 4.4 07/08/2018   CALCIUM 9.5 07/08/2018   GFRAA >60 07/08/2018   QFTBGOLDPLUS NEGATIVE 11/16/2017    Speciality Comments: No specialty comments available.  Procedures:  No procedures performed Allergies: Wellbutrin [bupropion]   Assessment / Plan:     Visit Diagnoses: Plantar fasciitis, left-patient states that she went for a long hike in May 2022 since then she has been experiencing some discomfort in her left heel.  She had tenderness on palpation of her left  heel and plantar fascia.  Offered x-ray of the left heel which she declined.  I gave her a handout on plantar fasciitis exercises.  I also advised in case her symptoms persist she should notify me.  Right shoulder pain-she fell in May 2022 and injured her right shoulder.  She was evaluated by Dr. Linda Payne.  She states she still have some discomfort in her right shoulder joint.  No warmth swelling or effusion was noted.  Left knee pain-she has been experiencing left knee pain off and on since her fall in May 2022.  She states Dr. Linda Payne did x-rays which were unremarkable.  She was told that she had "runner's knee".  She had no warmth or swelling on my examination today she had full range of motion of her knee joint.  Chronic lower back pain-she has been experiencing increased lower back pain since the fall.  She states she was evaluated by Dr. Linda Payne and x-rays were unremarkable.  She was told that it was muscle strain.  She tried heat and also some stretching which has been helpful.  Reactive arthritis, unspecified site (HCC) - +CCP, +HLA B27: She had no synovitis on my examination today.  She had history  of recurrent iritis and also intermittent joint swelling in the past.  Before she saw me she was treated with methotrexate according to patient.  She was given methotrexate again in June 2019 which she took only for a month then discontinued.  She denies having any episodes of recurrent swelling since then.  Recurrent iritis, unspecified laterality-she has had history of recurrent iritis for many years.  She states the symptoms have been mild since 2019.  She had recent flare of iritis in May 2022 after the fall which lasted for about 2 months.  She was treated by Dr. Alain Payne.  She does not think at this point she needs more aggressive therapy.  Raynaud's disease without gangrene-her Raynaud's symptoms are more prominent during the winter months.  Wearing warm clothing was discussed.  History of depression-controlled with medications per patient.  Orders: No orders of the defined types were placed in this encounter.  No orders of the defined types were placed in this encounter.   Follow-Up Instructions: Return if symptoms worsen or fail to improve, for iritis, reactive arthritis.   Alexis Merino, MD  Note - This record has been created using Editor, commissioning.  Chart creation errors have been sought, but may not always  have been located. Such creation errors do not reflect on  the standard of medical care.

## 2021-04-23 NOTE — Patient Instructions (Signed)
Plantar Fasciitis Rehab Ask your health care provider which exercises are safe for you. Do exercises exactly as told by your health care provider and adjust them as directed. It is normal to feel mild stretching, pulling, tightness, or discomfort as you do these exercises. Stop right away if you feel sudden pain or your pain gets worse. Do not begin these exercises until told by your health care provider. Stretching and range-of-motion exercises These exercises warm up your muscles and joints and improve the movement and flexibility of your foot. These exercises also help to relieve pain. Plantar fascia stretch  Sit with your left / right leg crossed over your opposite knee. Hold your heel with one hand with that thumb near your arch. With your other hand, hold your toes and gently pull them back toward the top of your foot. You should feel a stretch on the base (bottom) of your toes, or the bottom of your foot (plantar fascia), or both. Hold this stretch for__________ seconds. Slowly release your toes and return to the starting position. Repeat __________ times. Complete this exercise __________ times a day. Gastrocnemius stretch, standing This exercise is also called a calf (gastroc) stretch. It stretches the muscles in the back of the upper calf. Stand with your hands against a wall. Extend your left / right leg behind you, and bend your front knee slightly. Keeping your heels on the floor, your toes facing forward, and your back knee straight, shift your weight toward the wall. Do not arch your back. You should feel a gentle stretch in your upper calf. Hold this position for __________ seconds. Repeat __________ times. Complete this exercise __________ times a day. Soleus stretch, standing This exercise is also called a calf (soleus) stretch. It stretches the muscles in the back of the lower calf. Stand with your hands against a wall. Extend your left / right leg behind you, and bend your  front knee slightly. Keeping your heels on the floor and your toes facing forward, bend your back knee and shift your weight slightly over your back leg. You should feel a gentle stretch deep in your lower calf. Hold this position for __________ seconds. Repeat __________ times. Complete this exercise __________ times a day. Gastroc and soleus stretch, standing step This exercise stretches the muscles in the back of the lower leg. These muscles are in the upper calf (gastrocnemius) and the lower calf (soleus). Stand with the ball of your left / right foot on the front of a step. The ball of your foot is on the walking surface, right under your toes. Keep your other foot firmly on the same step. Hold on to the wall or a railing for balance. Slowly lift your other foot, allowing your body weight to press your heel down over the edge of the front of the step. Keep knee straight and unbent. You should feel a stretch in your calf. Hold this position for __________ seconds. Return both feet to the step. Repeat this exercise with a slight bend in your left / right knee. Repeat __________ times with your left / right knee straight and __________ times with your left / right knee bent. Complete this exercise __________ times a day. Balance exercise This exercise builds your balance and strength control of your arch to help take pressure off your plantar fascia. Single leg stand If this exercise is too easy, you can try it with your eyes closed or while standing on a pillow. Without shoes, stand near a   railing or in a doorway. You may hold on to the railing or door frame as needed. Stand on your left / right foot. Keep your big toe down on the floor and lift the arch of your foot. You should feel a stretch across the bottom of your foot and your arch. Do not let your foot roll inward. Hold this position for __________ seconds. Repeat __________ times. Complete this exercise __________ times a day. This  information is not intended to replace advice given to you by your health care provider. Make sure you discuss any questions you have with your health care provider. Document Revised: 04/04/2020 Document Reviewed: 04/04/2020 Elsevier Patient Education  [place].

## 2021-09-25 ENCOUNTER — Other Ambulatory Visit: Payer: Self-pay | Admitting: Obstetrics and Gynecology

## 2021-09-25 DIAGNOSIS — N951 Menopausal and female climacteric states: Secondary | ICD-10-CM | POA: Diagnosis not present

## 2021-09-25 DIAGNOSIS — N6310 Unspecified lump in the right breast, unspecified quadrant: Secondary | ICD-10-CM

## 2021-09-25 DIAGNOSIS — N6314 Unspecified lump in the right breast, lower inner quadrant: Secondary | ICD-10-CM | POA: Diagnosis not present

## 2021-09-25 DIAGNOSIS — Z8742 Personal history of other diseases of the female genital tract: Secondary | ICD-10-CM | POA: Diagnosis not present

## 2021-09-25 DIAGNOSIS — Z9189 Other specified personal risk factors, not elsewhere classified: Secondary | ICD-10-CM | POA: Diagnosis not present

## 2021-10-10 ENCOUNTER — Other Ambulatory Visit: Payer: Medicare Other

## 2021-10-21 ENCOUNTER — Ambulatory Visit
Admission: RE | Admit: 2021-10-21 | Discharge: 2021-10-21 | Disposition: A | Discharge status: Home / Self Care | Payer: Medicare Other | Source: Ambulatory Visit | Attending: Obstetrics and Gynecology | Admitting: Obstetrics and Gynecology

## 2021-10-21 ENCOUNTER — Ambulatory Visit
Admission: RE | Admit: 2021-10-21 | Discharge: 2021-10-21 | Disposition: A | Discharge status: Home / Self Care | Payer: Medicare HMO | Source: Ambulatory Visit | Attending: Obstetrics and Gynecology | Admitting: Obstetrics and Gynecology

## 2021-10-21 DIAGNOSIS — N6310 Unspecified lump in the right breast, unspecified quadrant: Secondary | ICD-10-CM

## 2021-10-21 DIAGNOSIS — N6312 Unspecified lump in the right breast, upper inner quadrant: Secondary | ICD-10-CM | POA: Diagnosis not present

## 2021-10-21 DIAGNOSIS — R922 Inconclusive mammogram: Secondary | ICD-10-CM | POA: Diagnosis not present

## 2021-12-08 DIAGNOSIS — Z8249 Family history of ischemic heart disease and other diseases of the circulatory system: Secondary | ICD-10-CM | POA: Diagnosis not present

## 2021-12-08 DIAGNOSIS — R69 Illness, unspecified: Secondary | ICD-10-CM | POA: Diagnosis not present

## 2021-12-08 DIAGNOSIS — R32 Unspecified urinary incontinence: Secondary | ICD-10-CM | POA: Diagnosis not present

## 2021-12-08 DIAGNOSIS — Z008 Encounter for other general examination: Secondary | ICD-10-CM | POA: Diagnosis not present

## 2021-12-08 DIAGNOSIS — M810 Age-related osteoporosis without current pathological fracture: Secondary | ICD-10-CM | POA: Diagnosis not present

## 2021-12-08 DIAGNOSIS — Z8262 Family history of osteoporosis: Secondary | ICD-10-CM | POA: Diagnosis not present

## 2021-12-08 DIAGNOSIS — H209 Unspecified iridocyclitis: Secondary | ICD-10-CM | POA: Diagnosis not present

## 2021-12-08 DIAGNOSIS — Z7983 Long term (current) use of bisphosphonates: Secondary | ICD-10-CM | POA: Diagnosis not present

## 2021-12-15 DIAGNOSIS — H5213 Myopia, bilateral: Secondary | ICD-10-CM | POA: Diagnosis not present

## 2021-12-26 DIAGNOSIS — Z0000 Encounter for general adult medical examination without abnormal findings: Secondary | ICD-10-CM | POA: Diagnosis not present

## 2021-12-26 DIAGNOSIS — R69 Illness, unspecified: Secondary | ICD-10-CM | POA: Diagnosis not present

## 2021-12-26 DIAGNOSIS — R9431 Abnormal electrocardiogram [ECG] [EKG]: Secondary | ICD-10-CM | POA: Diagnosis not present

## 2021-12-26 DIAGNOSIS — M810 Age-related osteoporosis without current pathological fracture: Secondary | ICD-10-CM | POA: Diagnosis not present

## 2021-12-26 DIAGNOSIS — H9313 Tinnitus, bilateral: Secondary | ICD-10-CM | POA: Diagnosis not present

## 2021-12-26 DIAGNOSIS — E559 Vitamin D deficiency, unspecified: Secondary | ICD-10-CM | POA: Diagnosis not present

## 2021-12-26 DIAGNOSIS — M47816 Spondylosis without myelopathy or radiculopathy, lumbar region: Secondary | ICD-10-CM | POA: Diagnosis not present

## 2021-12-26 DIAGNOSIS — I7300 Raynaud's syndrome without gangrene: Secondary | ICD-10-CM | POA: Diagnosis not present

## 2021-12-26 DIAGNOSIS — H903 Sensorineural hearing loss, bilateral: Secondary | ICD-10-CM | POA: Diagnosis not present

## 2022-01-13 ENCOUNTER — Encounter (HOSPITAL_BASED_OUTPATIENT_CLINIC_OR_DEPARTMENT_OTHER): Payer: Self-pay

## 2022-01-13 ENCOUNTER — Emergency Department (HOSPITAL_BASED_OUTPATIENT_CLINIC_OR_DEPARTMENT_OTHER): Payer: Medicare HMO | Admitting: Radiology

## 2022-01-13 ENCOUNTER — Emergency Department (HOSPITAL_BASED_OUTPATIENT_CLINIC_OR_DEPARTMENT_OTHER)
Admission: EM | Admit: 2022-01-13 | Discharge: 2022-01-13 | Disposition: A | Discharge status: Home / Self Care | Payer: Medicare HMO | Attending: Emergency Medicine | Admitting: Emergency Medicine

## 2022-01-13 ENCOUNTER — Other Ambulatory Visit: Payer: Self-pay

## 2022-01-13 DIAGNOSIS — J439 Emphysema, unspecified: Secondary | ICD-10-CM | POA: Diagnosis not present

## 2022-01-13 DIAGNOSIS — R079 Chest pain, unspecified: Secondary | ICD-10-CM | POA: Diagnosis not present

## 2022-01-13 DIAGNOSIS — R0789 Other chest pain: Secondary | ICD-10-CM

## 2022-01-13 DIAGNOSIS — M25512 Pain in left shoulder: Secondary | ICD-10-CM | POA: Diagnosis not present

## 2022-01-13 DIAGNOSIS — Z79899 Other long term (current) drug therapy: Secondary | ICD-10-CM | POA: Diagnosis not present

## 2022-01-13 LAB — BASIC METABOLIC PANEL
Anion gap: 9 (ref 5–15)
BUN: 15 mg/dL (ref 8–23)
CO2: 27 mmol/L (ref 22–32)
Calcium: 9.7 mg/dL (ref 8.9–10.3)
Chloride: 103 mmol/L (ref 98–111)
Creatinine, Ser: 0.88 mg/dL (ref 0.44–1.00)
GFR, Estimated: >60 mL/min (ref >60)
Glucose, Bld: 83 mg/dL (ref 70–99)
Potassium: 3.7 mmol/L (ref 3.5–5.1)
Sodium: 139 mmol/L (ref 135–145)

## 2022-01-13 LAB — CBC
HCT: 35.9 % — ABNORMAL LOW (ref 36.0–46.0)
Hemoglobin: 12 g/dL (ref 12.0–15.0)
MCH: 31.3 pg (ref 26.0–34.0)
MCHC: 33.4 g/dL (ref 30.0–36.0)
MCV: 93.7 fL (ref 80.0–100.0)
Platelets: 262 10*3/uL (ref 150–400)
RBC: 3.83 MIL/uL — ABNORMAL LOW (ref 3.87–5.11)
RDW: 12.7 % (ref 11.5–15.5)
WBC: 6.2 10*3/uL (ref 4.0–10.5)
nRBC: 0 % (ref 0.0–0.2)

## 2022-01-13 LAB — D-DIMER, QUANTITATIVE: D-Dimer, Quant: <0.27 ug/mL-FEU (ref 0.00–0.50)

## 2022-01-13 LAB — TROPONIN I (HIGH SENSITIVITY)
Troponin I (High Sensitivity): <2 ng/L (ref <18)
Troponin I (High Sensitivity): <2 ng/L (ref <18)

## 2022-01-13 MED ORDER — KETOROLAC TROMETHAMINE 60 MG/2ML IM SOLN
30.0000 mg | Freq: Once | INTRAMUSCULAR | Status: AC
  Administered 2022-01-13: 30 mg via INTRAMUSCULAR
  Filled 2022-01-13: qty 2

## 2022-01-13 MED ORDER — LIDOCAINE 5 % EX PTCH
1.0000 | MEDICATED_PATCH | CUTANEOUS | Status: DC
  Administered 2022-01-13: 1 via TRANSDERMAL
  Filled 2022-01-13: qty 1

## 2022-01-13 MED ORDER — LIDOCAINE 4 % EX PTCH: 1.0000 | MEDICATED_PATCH | CUTANEOUS | 0 refills | Status: AC

## 2022-01-13 NOTE — ED Triage Notes (Signed)
Pt c/o intermittent chest pain that radiates to the left side of her chest and up to her back for about a month. States symptoms worsened today.

## 2022-01-13 NOTE — Discharge Instructions (Addendum)
Recommend high dose NSAIDs for the next three days, follow-up outpatient with cardiology as scheduled.  Follow-up with your PCP.  Your EKG, chest x-ray, laboratory work-up was reassuring.

## 2022-01-13 NOTE — ED Notes (Signed)
Discharge instructions including pain management and follow up care discussed with pt. Pt verbalized understanding with no questions at this time. Pt to go home with s/o at bedside.

## 2022-01-13 NOTE — ED Provider Notes (Signed)
[facility]ERG[facility] DEPT Provider Note   CSN: [id number] Arrival date & time: 01/13/22  1610     History  No chief complaint on file.   Alexis Payne is a 66 y.o. female with history of reactive arthritis presents with 1 month of intermittent left-sided chest pain that worsened today.  She states that the pain is sharp in nature, 7/10 in severity, and worsens with exhalation.  She states that the pain occurs at rest as well as when she is active and notes that exertion does not worsen the pain.  The patient is also complaining of intermittent left subcostal pain over the last month.  She states that the pain can occur independently of her chest pain and does not feel as though the pain is radiating to this region. The patient is also complaining of intermittent pain in her left shoulder that began today. She states that this pain can occur independently of her chest pain and does not feel as though it is radiating to the shoulder.   The patient is also complaining of 5 days of diarrhea with dark brown stool.   Patient denies experiencing recent trauma or injuries.  She denies shortness of breath, cough, fever, chills, fatigue, back pain, abdominal pain, hematochezia, extremity pain, dysuria.  Patient denies a history of hypertension, diabetes, CHF, A-fib.   The history is provided by the patient.       Home Medications Prior to Admission medications   Medication Sig Start Date End Date Taking? Authorizing Provider  alendronate (FOSAMAX) 70 MG tablet PLEASE SEE ATTACHED FOR DETAILED DIRECTIONS 04/12/21   [provider]  Calcium Carbonate (CALCIUM 600 PO) Take 1 Dose by mouth daily.    [provider]  FLUoxetine (PROZAC) 20 MG tablet Take 20 mg by mouth every morning.    [provider]  folic acid (FOLVITE) 1 MG tablet Take 2 tablets (2 mg total) by mouth daily. Patient not taking: Reported on 04/23/2021 12/14/17   Ofilia Neas, PA-C   homatropine 5 % ophthalmic solution Place 1 drop into both eyes See admin instructions. Tapering down dose up to 4 times daily for iritis as needed Patient not taking: Reported on 04/23/2021    [provider]  methotrexate 50 MG/2ML injection INJECT 0.6ML ONCE WEEKLY Patient not taking: Reported on 04/23/2021 03/21/18   Bo Merino, MD  prednisoLONE acetate (PRED FORTE) 1 % ophthalmic suspension Place 1 drop into both eyes See admin instructions. Tapering dose up to 3 times daily for iritis as needed Patient not taking: Reported on 04/23/2021 06/01/16   [provider]  Tuberculin-Allergy Syringes 27G X 1/2" 1 ML MISC Use to inject methotrexate weekly. Patient not taking: Reported on 04/23/2021 12/14/17   Bo Merino, MD  Vitamin D, Cholecalciferol, 1000 units CAPS Take by mouth daily.    [provider]      Allergies    Wellbutrin [bupropion]    Review of Systems   Review of Systems  Constitutional:  Negative for chills and fever.  Respiratory:  Negative for cough and shortness of breath.   Cardiovascular:  Positive for chest pain.  Gastrointestinal:  Positive for diarrhea. Negative for abdominal pain, blood in stool, nausea and vomiting.  Genitourinary:  Negative for dysuria.  Musculoskeletal:  Negative for back pain.       Left shoulder pain. Left subcostal pain. Denies extremity pain.     Physical Exam Updated Vital Signs BP 122/67   Pulse 60  Temp 98.5 F (36.9 C)   Resp 14   Ht '5\' 7"'$  (1.702 m)   Wt 59.4 kg   SpO2 97%   BMI 20.52 kg/m  Physical Exam Constitutional:      General: She is not in acute distress. HENT:     Mouth/Throat:     Mouth: Mucous membranes are moist.  Cardiovascular:     Rate and Rhythm: Normal rate and regular rhythm.     Pulses: Normal pulses.     Heart sounds: No murmur heard.    No friction rub. No gallop.  Pulmonary:     Effort: Pulmonary effort is normal.     Breath sounds: No wheezing, rhonchi  or rales.  Abdominal:     General: Abdomen is flat.     Tenderness: There is no abdominal tenderness.  Musculoskeletal:     Right lower leg: No edema.     Left lower leg: No edema.     Comments: Tenderness to palpation of the left chest and left upper shoulder. Tenderness to palpation of the left subcostal region anteriorly.   Skin:    General: Skin is warm and dry.     Findings: No rash.  Neurological:     Mental Status: She is alert.     ED Results / Procedures / Treatments   Labs (all labs ordered are listed, but only abnormal results are displayed) Labs Reviewed  CBC - Abnormal; Notable for the following components:      Result Value   RBC 3.83 (*)    HCT 35.9 (*)    All other components within normal limits  BASIC METABOLIC PANEL  TROPONIN I (HIGH SENSITIVITY)  TROPONIN I (HIGH SENSITIVITY)    EKG None  Radiology DG Chest 2 View  Result Date: 01/13/2022 CLINICAL DATA:  Intermittent chest pain radiating to the left side for about 1 month. Worsened today. EXAM: CHEST - 2 VIEW COMPARISON:  Chest two views 07/08/2018 FINDINGS: Cardiac silhouette and mediastinal contours are within normal limits. Mild lucencies within the upper lungs again suggesting chronic emphysematous changes. No focal airspace opacity to indicate pneumonia. No pleural effusion or pneumothorax. Mild-to-moderate multilevel degenerative disc changes of the thoracic spine. Old healed fracture deformity of the proximal right humeral diaphysis. IMPRESSION: Unchanged mild emphysematous changes. No active cardiopulmonary disease. Electronically Signed   By: Yvonne Kendall M.D.   On: 01/13/2022 16:49    Procedures Procedures    Medications Ordered in ED Medications - No data to display  ED Course/ Medical Decision Making/ A&P                           Medical Decision Making LURLIE WIGEN is a 66 y.o. female with history of reactive arthritis presents with 1 month of intermittent left-sided chest pain  that worsened today.  Differential diagnosis includes but is not limited to pulmonary embolism, pneumonia, musculoskeletal chest pain.  I am less concerned for pulmonary embolism or pneumonia given that the patient's left upper chest pain, left shoulder pain, and left subcostal pain are reproducible on exam.  Will order troponin, D-dimer, EKG, and chest x-ray to rule out potential acute cardiopulmonary disease.  Will order CBC and BMP to assess cell counts, electrolytes, renal function. Patient discussed with Dr. Kellie Simmering.  Amount and/or Complexity of Data Reviewed Labs: ordered. Decision-making details documented in ED Course. Radiology: ordered and independent interpretation performed. Decision-making details documented in ED Course.  Risk OTC drugs.  Prescription drug management.   6:28 PM Patient's BMP is unremarkable.  Patient's CBC is not concerning.  Patient's troponin and EKG are negative. patient's chest x-ray is negative for active cardiopulmonary disease but does note unchanged mild emphysematous changes.  Awaiting results of D-dimer.  8:00 PM Patient's D-dimer is negative.  Given the results of the patient's EKG, chest x-ray, troponin, and D-dimer I am less concerned for acute cardiopulmonary disease.  Dr. Armandina Gemma updated the patient on the results of her lab work, EKG, and imaging.  He discussed her diagnosis of musculoskeletal chest pain.  He discussed use of NSAIDs at home for pain.  The patient he discussed with the patient the plan to follow-up with her primary care doctor and with cardiology about her symptoms.  He discussed plan to return to the ED should the patient develop new or worsening symptoms including but not limited to chest pain, shortness of breath, syncope.  Patient agrees with the plan.         Final Clinical Impression(s) / ED Diagnoses Final diagnoses:  None    Rx / DC Orders ED Discharge Orders     None         Blakleigh Straw, Claudia Desanctis, MD 01/13/22  2248    Regan Lemming, MD 01/13/22 2346

## 2022-01-16 ENCOUNTER — Other Ambulatory Visit: Payer: Self-pay | Admitting: Internal Medicine

## 2022-01-16 DIAGNOSIS — R9431 Abnormal electrocardiogram [ECG] [EKG]: Secondary | ICD-10-CM

## 2022-01-18 ENCOUNTER — Encounter: Payer: Self-pay | Admitting: Cardiovascular Disease

## 2022-01-18 NOTE — Progress Notes (Unsigned)
Cardiology Office Note:    Date:  01/20/2022   ID:  Alexis Payne, DOB [birth year], MRN [id number]  PCP:  Leeroy Cha, MD  Cardiologist:  Dacian Orrico  Electrophysiologist:  None   Referring MD: Leeroy Cha,*   Chief Complaint  Patient presents with   Chest Pain    History of Present Illness: [name], 2020   Alexis Payne is a 66 y.o. female with a hx of arthritis who presents for further evaluation of some chest pain   Has burning in her chest for the past month Not related to change of position or , no pleuretic,  Not related to twisting torso She walks several times s a week - 1 hours 3 times a week .  Not related to arm movement .  Has arthritis - get injection ,  Tablets gave her flu like symptoms   Has occasional abdominal pain ,   She has tried pepcid  Abdominal US showed diverticuli Up to date with her colonoscopies.    Has gall stones  Mother had a pacer. Grandmother had a MI   January 20, 2022 Alexis Payne is seen today for follow up of her chest pain She was seen by her primary MD , ECG shows poor R progression   Went to the ER at [facility]  Was found to have costochondritis  Complains of pain along her Left breast  Is reproducible   Wearing a bra causes it to hurt worse Might last for several minutes But has lasted as long as 2 hours   Has not been lifting anything heavy, nothing out of the ordinary    CP for 2 weeks  Also has had diarrhea for 2 weeks Has LUQ pain off and on for many months No blood in her stool  Has not tried Immodium  Tried pepto bismol  - no relief    Fam. Hx :   + Palpitations in her father . Grandfather ( mothers father ) had an MI      Past Medical History:  Diagnosis Date   Reactive arthritis ([place])     Past Surgical History:  Procedure Laterality Date   BREAST EXCISIONAL BIOPSY Right 1980   benign   COLONOSCOPY WITH PROPOFOL N/A 08/31/2016   Procedure: COLONOSCOPY WITH PROPOFOL;  Surgeon: Garlan Fair, MD;  Location: [facility];  Service: Endoscopy;  Laterality: N/A;   FRACTURE SURGERY     right shoulder   HUMERUS SURGERY      Current Medications: Current Meds  Medication Sig   alendronate (FOSAMAX) 70 MG tablet PLEASE SEE ATTACHED FOR DETAILED DIRECTIONS   Calcium Carbonate (CALCIUM 600 PO) Take 1 Dose by mouth daily.   FLUoxetine (PROZAC) 20 MG tablet Take 20 mg by mouth every morning.   lidocaine (HM LIDOCAINE PATCH) 4 % Place 1 patch onto the skin daily for 14 days.   metoprolol tartrate (LOPRESSOR) 100 MG tablet Take 1 tablet by mouth two hours prior to scan   Vitamin D, Cholecalciferol, 1000 units CAPS Take by mouth daily.     Allergies:   Wellbutrin [bupropion]   Social History   Socioeconomic History   Marital status: Married    Spouse name: Not on file   Number of children: Not on file   Years of education: Not on file   Highest education level: Not on file  Occupational History   Not on file  Tobacco Use   Smoking status: Never   Smokeless tobacco: Never  Vaping Use  Vaping Use: Never used  Substance and Sexual Activity   Alcohol use: Yes    Alcohol/week: 1.0 standard drink of alcohol    Types: 1 Glasses of wine per week    Comment: occ   Drug use: No   Sexual activity: Not on file  Other Topics Concern   Not on file  Social History Narrative   Not on file   Social Determinants of Health   Financial Resource Strain: Not on file  Food Insecurity: Not on file  Transportation Needs: Not on file  Physical Activity: Not on file  Stress: Not on file  Social Connections: Not on file     Family History: The patient's family history includes Heart disease in her mother; Pulmonary fibrosis in her mother.  ROS:   Please see the history of present illness.     All other systems reviewed and are negative.  EKGs/Labs/Other Studies Reviewed:    The following studies were reviewed today:   EKG: January 20, 2022: Sinus bradycardia.  Poor R wave  progression.   Recent Labs: 01/13/2022: BUN 15; Creatinine, Ser 0.88; Hemoglobin 12.0; Platelets 262; Potassium 3.7; Sodium 139  Recent Lipid Panel No results found for: "CHOL", "TRIG", "HDL", "CHOLHDL", "VLDL", "LDLCALC", "LDLDIRECT"  Physical Exam:    Physical Exam: Blood pressure 122/72, pulse 69, height '5\' 8"'$  (1.727 m), weight 133 lb (60.3 kg), SpO2 99 %.  GEN:  Well nourished, well developed in no acute distress HEENT: Normal NECK: No JVD; No carotid bruits LYMPHATICS: No lymphadenopathy CARDIAC: RRR , no murmurs, rubs, gallops,  she has tenderness along her L 4-5 rib - likely costochondritis  RESPIRATORY:  Clear to auscultation without rales, wheezing or rhonchi  ABDOMEN: Soft, non-tender, non-distended MUSCULOSKELETAL:  No edema; No deformity  SKIN: Warm and dry NEUROLOGIC:  Alert and oriented x 3   ASSESSMENT:    1. Family history of early CAD   2. Chest tightness   3. Acute pain of left shoulder   4. Chest pain, unspecified type   5. Pre-procedural cardiovascular examination     PLAN:      Chest discomfort/chest tightness: Alexis Payne presents for further evaluation of her chest discomfort.  We thought that she has had costochondritis in the past and she does have some evidence of that currently.  She seems to be having a different kind of chest pain.  This occurs at various times.  It last for several minutes and seems to radiate to her left shoulder.  She has been under a lot of stress with her husband's recent illness. She has a family history of coronary artery disease.  I would like to proceed with a coronary CT angiogram for further evaluation.  We will also get an echocardiogram for further evaluation of this chest pain and to make sure that she does not have pericarditis.    2.  Mild hyperlipidemia: Her LDL is 109.  She does have a family history of coronary artery disease.  We will schedule her for lipid levels today.  I will see her in 2 to 3 months for a  follow-up visit.   Medication Adjustments/Labs and Tests Ordered: Current medicines are reviewed at length with the patient today.  Concerns regarding medicines are outlined above.  Orders Placed This Encounter  Procedures   CT CORONARY MORPH W/CTA COR W/SCORE W/CA W/CM &/OR WO/CM   Basic metabolic panel   Lipid panel   EKG 12-Lead   ECHOCARDIOGRAM COMPLETE   Meds ordered this encounter  Medications   metoprolol tartrate (LOPRESSOR) 100 MG tablet    Sig: Take 1 tablet by mouth two hours prior to scan    Dispense:  1 tablet    Refill:  0     Patient Instructions  Medication Instructions:  Your physician recommends that you continue on your current medications as directed. Please refer to the Current Medication list given to you today.  *If you need a refill on your cardiac medications before your next appointment, please call your pharmacy*   Lab Work: LIPIDS and BMET today If you have labs (blood work) drawn today and your tests are completely normal, you will receive your results only by: [place] (if you have MyChart) OR A paper copy in the mail If you have any lab test that is abnormal or we need to change your treatment, we will call you to review the results.  Testing/Procedures: Coronary CTA Your physician has requested that you have cardiac CT. Cardiac computed tomography (CT) is a painless test that uses an x-ray machine to take clear, detailed pictures of your heart. For further information please visit HugeFiesta.tn. Please follow instruction sheet as given.  ECHO Your physician has requested that you have an echocardiogram. Echocardiography is a painless test that uses sound waves to create images of your heart. It provides your doctor with information about the size and shape of your heart and how well your heart's chambers and valves are working. This procedure takes approximately one hour. There are no restrictions for this  procedure.  Follow-Up: At [facility], you and your health needs are our priority.  As part of our continuing mission to provide you with exceptional heart care, we have created designated Provider Care Teams.  These Care Teams include your primary Cardiologist (physician) and Advanced Practice Providers (APPs -  Physician Assistants and Nurse Practitioners) who all work together to provide you with the care you need, when you need it.  Your next appointment:   3 month(s)  The format for your next appointment:   In Person  Provider:   Mertie Moores, MD     Other Instructions   Your cardiac CT will be scheduled at :   [facility] [address] [place], [place] [address] [phone]  please arrive at the [facility] and Children's Entrance (Entrance C2) of [facility] 30 minutes prior to test start time. You can use the FREE valet parking offered at entrance C (encouraged to control the heart rate for the test)  Proceed to the [facility] Radiology Department (first floor) to check-in and test prep.  All radiology patients and guests should use entrance C2 at [facility], accessed from [facility], even though the hospital's physical address listed is [address].     Please follow these instructions carefully (unless otherwise directed):  On the Night Before the Test: Be sure to Drink plenty of water. Do not consume any caffeinated/decaffeinated beverages or chocolate 12 hours prior to your test. Do not take any antihistamines 12 hours prior to your test.  On the Day of the Test: Drink plenty of water until 1 hour prior to the test. Do not eat any food 4 hours prior to the test. You may take your regular medications prior to the test.  Take metoprolol (Lopressor) two hours prior to test. FEMALES- please wear underwire-free bra if available, avoid dresses & tight clothing      After the Test: Drink plenty of  water. After receiving IV contrast, you may experience a mild flushed feeling. This is normal. On occasion, you may experience a mild rash up to 24 hours after the test. This is not dangerous. If this occurs, you can take Benadryl 25 mg and increase your fluid intake. If you experience trouble breathing, this can be serious. If it is severe call 911 IMMEDIATELY. If it is mild, please call our office. If you take any of these medications: Glipizide/Metformin, Avandament, Glucavance, please do not take 48 hours after completing test unless otherwise instructed.  We will call to schedule your test 2-4 weeks out understanding that some insurance companies will need an authorization prior to the service being performed.   For non-scheduling related questions, please contact the cardiac imaging nurse navigator should you have any questions/concerns: Marchia Bond, Cardiac Imaging Nurse Navigator Gordy Clement, Cardiac Imaging Nurse Navigator [facility] Direct Office Dial: [phone]   For scheduling needs, including cancellations and rescheduling, please call [place], [phone].   Important Information About Sugar         Signed, Mertie Moores, MD  01/20/2022 5:39 PM    [facility]

## 2022-01-20 ENCOUNTER — Ambulatory Visit (INDEPENDENT_AMBULATORY_CARE_PROVIDER_SITE_OTHER): Payer: Medicare HMO | Admitting: Cardiovascular Disease

## 2022-01-20 ENCOUNTER — Encounter: Payer: Self-pay | Admitting: Cardiovascular Disease

## 2022-01-20 VITALS — BP 122/72 | HR 69 | Ht 68.0 in | Wt 133.0 lb

## 2022-01-20 DIAGNOSIS — Z01810 Encounter for preprocedural cardiovascular examination: Secondary | ICD-10-CM | POA: Diagnosis not present

## 2022-01-20 DIAGNOSIS — Z8249 Family history of ischemic heart disease and other diseases of the circulatory system: Secondary | ICD-10-CM | POA: Diagnosis not present

## 2022-01-20 DIAGNOSIS — R0789 Other chest pain: Secondary | ICD-10-CM | POA: Diagnosis not present

## 2022-01-20 DIAGNOSIS — M25512 Pain in left shoulder: Secondary | ICD-10-CM | POA: Diagnosis not present

## 2022-01-20 DIAGNOSIS — R079 Chest pain, unspecified: Secondary | ICD-10-CM | POA: Diagnosis not present

## 2022-01-20 MED ORDER — METOPROLOL TARTRATE 100 MG PO TABS: ORAL_TABLET | ORAL | 0 refills | Status: DC

## 2022-01-20 NOTE — Patient Instructions (Addendum)
Medication Instructions:  Your physician recommends that you continue on your current medications as directed. Please refer to the Current Medication list given to you today.  *If you need a refill on your cardiac medications before your next appointment, please call your pharmacy*   Lab Work: LIPIDS and BMET today If you have labs (blood work) drawn today and your tests are completely normal, you will receive your results only by: [place] (if you have MyChart) OR A paper copy in the mail If you have any lab test that is abnormal or we need to change your treatment, we will call you to review the results.  Testing/Procedures: Coronary CTA Your physician has requested that you have cardiac CT. Cardiac computed tomography (CT) is a painless test that uses an x-ray machine to take clear, detailed pictures of your heart. For further information please visit HugeFiesta.tn. Please follow instruction sheet as given.  ECHO Your physician has requested that you have an echocardiogram. Echocardiography is a painless test that uses sound waves to create images of your heart. It provides your doctor with information about the size and shape of your heart and how well your heart's chambers and valves are working. This procedure takes approximately one hour. There are no restrictions for this procedure.  Follow-Up: At [facility], you and your health needs are our priority.  As part of our continuing mission to provide you with exceptional heart care, we have created designated Provider Care Teams.  These Care Teams include your primary Cardiologist (physician) and Advanced Practice Providers (APPs -  Physician Assistants and Nurse Practitioners) who all work together to provide you with the care you need, when you need it.  Your next appointment:   3 month(s)  The format for your next appointment:   In Person  Provider:   Mertie Moores, MD     Other Instructions   Your cardiac  CT will be scheduled at :   [facility] [address] [place], [place] [address] [phone]  please arrive at the [facility] and Children's Entrance (Entrance C2) of [facility] 30 minutes prior to test start time. You can use the FREE valet parking offered at entrance C (encouraged to control the heart rate for the test)  Proceed to the [facility] Radiology Department (first floor) to check-in and test prep.  All radiology patients and guests should use entrance C2 at [facility], accessed from [facility], even though the hospital's physical address listed is [address]     Please follow these instructions carefully (unless otherwise directed):  On the Night Before the Test: Be sure to Drink plenty of water. Do not consume any caffeinated/decaffeinated beverages or chocolate 12 hours prior to your test. Do not take any antihistamines 12 hours prior to your test.  On the Day of the Test: Drink plenty of water until 1 hour prior to the test. Do not eat any food 4 hours prior to the test. You may take your regular medications prior to the test.  Take metoprolol (Lopressor) two hours prior to test. FEMALES- please wear underwire-free bra if available, avoid dresses & tight clothing      After the Test: Drink plenty of water. After receiving IV contrast, you may experience a mild flushed feeling. This is normal. On occasion, you may experience a mild rash up to 24 hours after the test. This is not dangerous. If this occurs, you can take Benadryl 25 mg  and increase your fluid intake. If you experience trouble breathing, this can be serious. If it is severe call 911 IMMEDIATELY. If it is mild, please call our office. If you take any of these medications: Glipizide/Metformin, Avandament, Glucavance, please do not take 48 hours after completing test unless otherwise instructed.  We will call to schedule your test 2-4 weeks out  understanding that some insurance companies will need an authorization prior to the service being performed.   For non-scheduling related questions, please contact the cardiac imaging nurse navigator should you have any questions/concerns: Marchia Bond, Cardiac Imaging Nurse Navigator Gordy Clement, Cardiac Imaging Nurse Navigator  [facility] Direct Office Dial: [phone]   For scheduling needs, including cancellations and rescheduling, please call [place], [phone].   Important Information About Sugar

## 2022-01-21 LAB — BASIC METABOLIC PANEL
BUN/Creatinine Ratio: 16 (ref 12–28)
BUN: 14 mg/dL (ref 8–27)
CO2: 24 mmol/L (ref 20–29)
Calcium: 9.4 mg/dL (ref 8.7–10.3)
Chloride: 102 mmol/L (ref 96–106)
Creatinine, Ser: 0.87 mg/dL (ref 0.57–1.00)
Glucose: 91 mg/dL (ref 70–99)
Potassium: 3.8 mmol/L (ref 3.5–5.2)
Sodium: 140 mmol/L (ref 134–144)
eGFR: 73 mL/min/{1.73_m2} (ref >59)

## 2022-01-21 LAB — LIPID PANEL
Chol/HDL Ratio: 2.4 ratio (ref 0.0–4.4)
Cholesterol, Total: 199 mg/dL (ref 100–199)
HDL: 83 mg/dL (ref >39)
LDL Chol Calc (NIH): 105 mg/dL — ABNORMAL HIGH (ref 0–99)
Triglycerides: 61 mg/dL (ref 0–149)
VLDL Cholesterol Cal: 11 mg/dL (ref 5–40)

## 2022-01-28 DIAGNOSIS — Z780 Asymptomatic menopausal state: Secondary | ICD-10-CM | POA: Diagnosis not present

## 2022-01-28 DIAGNOSIS — M810 Age-related osteoporosis without current pathological fracture: Secondary | ICD-10-CM | POA: Diagnosis not present

## 2022-01-28 DIAGNOSIS — M8588 Other specified disorders of bone density and structure, other site: Secondary | ICD-10-CM | POA: Diagnosis not present

## 2022-02-09 ENCOUNTER — Telehealth (HOSPITAL_COMMUNITY): Payer: Self-pay | Admitting: Emergency Medicine

## 2022-02-09 NOTE — Telephone Encounter (Signed)
Reaching out to patient to offer assistance regarding upcoming cardiac imaging study; pt verbalizes understanding of appt date/time, parking situation and where to check in, pre-test NPO status and medications ordered, and verified current allergies; name and call back number provided for further questions should they arise Marchia Bond RN Navigator Cardiac Imaging [facility] [facility] [phone] office [phone] cell  Denies iv issues Arrival 830 w/c entrance '100mg'$  metoprolol tartrate Pt will need to go directly to [address] for echo at 10:15am

## 2022-02-10 ENCOUNTER — Ambulatory Visit (HOSPITAL_COMMUNITY)
Admission: RE | Admit: 2022-02-10 | Discharge: 2022-02-10 | Disposition: A | Discharge status: Home / Self Care | Payer: Medicare HMO | Source: Ambulatory Visit | Attending: Cardiovascular Disease | Admitting: Cardiovascular Disease

## 2022-02-10 ENCOUNTER — Ambulatory Visit (HOSPITAL_BASED_OUTPATIENT_CLINIC_OR_DEPARTMENT_OTHER): Payer: Medicare HMO

## 2022-02-10 DIAGNOSIS — R079 Chest pain, unspecified: Secondary | ICD-10-CM

## 2022-02-10 DIAGNOSIS — I2510 Atherosclerotic heart disease of native coronary artery without angina pectoris: Secondary | ICD-10-CM | POA: Diagnosis not present

## 2022-02-10 DIAGNOSIS — R0789 Other chest pain: Secondary | ICD-10-CM

## 2022-02-10 LAB — ECHOCARDIOGRAM COMPLETE
Area-P 1/2: 2.39 cm2
S' Lateral: 2.6 cm

## 2022-02-10 MED ORDER — NITROGLYCERIN 0.4 MG SL SUBL
0.8000 mg | SUBLINGUAL_TABLET | Freq: Once | SUBLINGUAL | Status: AC
  Administered 2022-02-10: 0.8 mg via SUBLINGUAL

## 2022-02-10 MED ORDER — IOHEXOL 350 MG/ML SOLN
100.0000 mL | Freq: Once | INTRAVENOUS | Status: AC | PRN
  Administered 2022-02-10: 100 mL via INTRAVENOUS

## 2022-02-10 MED ORDER — NITROGLYCERIN 0.4 MG SL SUBL
SUBLINGUAL_TABLET | SUBLINGUAL | Status: AC
  Filled 2022-02-10: qty 2

## 2022-02-10 MED ORDER — SODIUM CHLORIDE 0.9 % IV BOLUS
250.0000 mL | Freq: Once | INTRAVENOUS | Status: AC
  Administered 2022-02-10: 250 mL via INTRAVENOUS

## 2022-02-11 ENCOUNTER — Telehealth: Payer: Self-pay

## 2022-02-11 DIAGNOSIS — Z79899 Other long term (current) drug therapy: Secondary | ICD-10-CM

## 2022-02-11 DIAGNOSIS — R079 Chest pain, unspecified: Secondary | ICD-10-CM

## 2022-02-11 MED ORDER — ROSUVASTATIN CALCIUM 10 MG PO TABS: 10.0000 mg | ORAL_TABLET | Freq: Every day | ORAL | 3 refills | Status: AC

## 2022-02-11 NOTE — Telephone Encounter (Signed)
-----   Message from Thayer Headings, MD sent at 02/10/2022  3:38 PM EDT ----- Mild plaque in the proximal RCA Other coronaries are normal  Coronary calcium score of 0  She does have minimal CAD so we should be aggressive with her LDL lowering therapy. Lets start rosuvastatin 10 mg a day .  Lipids, alt, BMP in 3 months   Her CP does not appear to be cardiac   I will see her on Oct. As scheduled.

## 2022-02-11 NOTE — Telephone Encounter (Signed)
Spoke with patient who agrees to plan. Will send Crestor into pharmacy on file and repeat labs are entered and scheduled for same day as October appt.

## 2022-03-16 DIAGNOSIS — T63441A Toxic effect of venom of bees, accidental (unintentional), initial encounter: Secondary | ICD-10-CM | POA: Diagnosis not present

## 2022-03-16 DIAGNOSIS — M810 Age-related osteoporosis without current pathological fracture: Secondary | ICD-10-CM | POA: Diagnosis not present

## 2022-03-16 DIAGNOSIS — L03113 Cellulitis of right upper limb: Secondary | ICD-10-CM | POA: Diagnosis not present

## 2022-03-24 DIAGNOSIS — T63441A Toxic effect of venom of bees, accidental (unintentional), initial encounter: Secondary | ICD-10-CM | POA: Diagnosis not present

## 2022-03-31 ENCOUNTER — Ambulatory Visit
Admission: RE | Admit: 2022-03-31 | Discharge: 2022-03-31 | Disposition: A | Discharge status: Home / Self Care | Payer: No Typology Code available for payment source | Source: Ambulatory Visit | Attending: Internal Medicine | Admitting: Internal Medicine

## 2022-03-31 DIAGNOSIS — R9431 Abnormal electrocardiogram [ECG] [EKG]: Secondary | ICD-10-CM

## 2022-04-20 ENCOUNTER — Encounter: Payer: Self-pay | Admitting: Cardiovascular Disease

## 2022-04-20 NOTE — Progress Notes (Unsigned)
Cardiology Office Note:    Date:  04/21/2022   ID:  Alexis Payne, DOB 1956/04/27, MRN [id number]  PCP:  Leeroy Cha, MD  Cardiologist:  Lowery Paullin  Electrophysiologist:  None   Referring MD: Leeroy Cha,*   Chief Complaint  Patient presents with   Chest Pain    History of Present Illness: [name], 2020   Alexis Payne is a 66 y.o. female with a hx of arthritis who presents for further evaluation of some chest pain   Has burning in her chest for the past month Not related to change of position or , no pleuretic,  Not related to twisting torso She walks several times s a week - 1 hours 3 times a week .  Not related to arm movement .  Has arthritis - get injection ,  Tablets gave her flu like symptoms   Has occasional abdominal pain ,   She has tried pepcid  Abdominal US showed diverticuli Up to date with her colonoscopies.    Has gall stones  Mother had a pacer. Grandmother had a MI   January 20, 2022 Alexis Payne is seen today for follow up of her chest pain She was seen by her primary MD , ECG shows poor R progression   Went to the ER at [facility]  Was found to have costochondritis  Complains of pain along her Left breast  Is reproducible   Wearing a bra causes it to hurt worse Might last for several minutes But has lasted as long as 2 hours   Has not been lifting anything heavy, nothing out of the ordinary    CP for 2 weeks  Also has had diarrhea for 2 weeks Has LUQ pain off and on for many months No blood in her stool  Has not tried Immodium  Tried pepto bismol  - no relief    Fam. Hx :   + Palpitations in her father . Jon Gills ( mothers father ) had an MI   Oct. 17, 2023 Alexis Payne is seen for further evaluation of her CP, family hx of CAD , HLD   Coronary CT angiogram revealed coronary calcium score of 0.  She has minimal coronary artery disease involving the proximal RCA.  The other vessels have no significant coronary artery disease.  We  started Rosuvastatin 10 mg at her last visit     Past Medical History:  Diagnosis Date   Reactive arthritis ([place])     Past Surgical History:  Procedure Laterality Date   BREAST EXCISIONAL BIOPSY Right 1980   benign   COLONOSCOPY WITH PROPOFOL N/A 08/31/2016   Procedure: COLONOSCOPY WITH PROPOFOL;  Surgeon: Garlan Fair, MD;  Location: [facility];  Service: Endoscopy;  Laterality: N/A;   FRACTURE SURGERY     right shoulder   HUMERUS SURGERY      Current Medications: Current Meds  Medication Sig   Calcium Carbonate (CALCIUM 600 PO) Take 1 Dose by mouth daily.   EPINEPHrine 0.3 mg/0.3 mL IJ SOAJ injection injection IM As Directed   FLUoxetine (PROZAC) 20 MG tablet Take 20 mg by mouth every morning.   homatropine 5 % ophthalmic solution Place 1 drop into both eyes See admin instructions. Tapering down dose up to 4 times daily for iritis as needed   prednisoLONE acetate (PRED FORTE) 1 % ophthalmic suspension Place 1 drop into both eyes See admin instructions. Tapering dose up to 3 times daily for iritis as needed   rosuvastatin (CRESTOR) 10 MG tablet  Take 1 tablet (10 mg total) by mouth daily.   Vitamin D, Cholecalciferol, 1000 units CAPS Take by mouth daily.     Allergies:   Bee venom and Wellbutrin [bupropion]   Social History   Socioeconomic History   Marital status: Married    Spouse name: Not on file   Number of children: Not on file   Years of education: Not on file   Highest education level: Not on file  Occupational History   Not on file  Tobacco Use   Smoking status: Never   Smokeless tobacco: Never  Vaping Use   Vaping Use: Never used  Substance and Sexual Activity   Alcohol use: Yes    Alcohol/week: 1.0 standard drink of alcohol    Types: 1 Glasses of wine per week    Comment: occ   Drug use: No   Sexual activity: Not on file  Other Topics Concern   Not on file  Social History Narrative   Not on file   Social Determinants of Health   Financial  Resource Strain: Not on file  Food Insecurity: Not on file  Transportation Needs: Not on file  Physical Activity: Not on file  Stress: Not on file  Social Connections: Not on file     Family History: The patient's family history includes Heart disease in her mother; Pulmonary fibrosis in her mother.  ROS:   Please see the history of present illness.     All other systems reviewed and are negative.  EKGs/Labs/Other Studies Reviewed:    The following studies were reviewed today:   EKG:     Recent Labs: 01/13/2022: Hemoglobin 12.0; Platelets 262 04/21/2022: ALT 14; BUN 11; Creatinine, Ser 0.92; Potassium 3.8; Sodium 142  Recent Lipid Panel    Component Value Date/Time   CHOL 157 04/21/2022 1058   TRIG 68 04/21/2022 1058   HDL 91 04/21/2022 1058   CHOLHDL 1.7 04/21/2022 1058   LDLCALC 53 04/21/2022 1058    Physical Exam:    Physical Exam: Blood pressure 108/60, pulse (!) 42, height '5\' 8"'$  (1.727 m), weight 133 lb (60.3 kg), SpO2 98 %.       GEN:  Well nourished, well developed in no acute distress HEENT: Normal NECK: No JVD; No carotid bruits LYMPHATICS: No lymphadenopathy CARDIAC: RRR , ? Soft click , soft systolic murmur  RESPIRATORY:  Clear to auscultation without rales, wheezing or rhonchi  ABDOMEN: palpable midline abdominal aorta  MUSCULOSKELETAL:  No edema; No deformity  SKIN: Warm and dry NEUROLOGIC:  Alert and oriented x 3    ASSESSMENT:    1. Palpable abdominal aorta   2. Pulsatile abdomen   3. Mild CAD   4. Mixed hyperlipidemia      PLAN:      Chest discomfort/chest tightness: Coronary CT angiogram reveals a coronary calcium score of 0.  She has minimal coronary artery disease in the proximal right coronary artery.  At this point I do not think that her chest pain is due to a cardiac etiology. .    2.  Mild hyperlipidemia: She has mild hyperlipidemia.  Given the fact that she has very mild coronary artery disease, I would like for her  LDL to be around 70.  3.  Pulsatile abdominal aorta: She has a pulsatile abdominal aorta.  She is a non-smoker.  I would like to do an abdominal duplex scan to evaluate her for the possibility of abdominal aortic aneurysm.     Medication Adjustments/Labs and  Tests Ordered: Current medicines are reviewed at length with the patient today.  Concerns regarding medicines are outlined above.  Orders Placed This Encounter  Procedures   VAS [place] AAA DUPLEX   No orders of the defined types were placed in this encounter.    Patient Instructions  Medication Instructions:  Your physician recommends that you continue on your current medications as directed. Please refer to the Current Medication list given to you today.  *If you need a refill on your cardiac medications before your next appointment, please call your pharmacy*   Lab Work: LIPIDS, ALT, BMET today If you have labs (blood work) drawn today and your tests are completely normal, you will receive your results only by: [place] (if you have MyChart) OR A paper copy in the mail If you have any lab test that is abnormal or we need to change your treatment, we will call you to review the results.   Testing/Procedures: Abdominal Ultrasound duplex Your physician has requested that you have an abdominal aorta duplex. During this test, an ultrasound is used to evaluate the aorta. Allow 30 minutes for this exam. Do not eat after midnight the day before and avoid carbonated beverages    Follow-Up: At [facility], you and your health needs are our priority.  As part of our continuing mission to provide you with exceptional heart care, we have created designated Provider Care Teams.  These Care Teams include your primary Cardiologist (physician) and Advanced Practice Providers (APPs -  Physician Assistants and Nurse Practitioners) who all work together to provide you with the care you need, when you need it.  We recommend  signing up for the patient portal called "MyChart".  Sign up information is provided on this After Visit Summary.  MyChart is used to connect with patients for Virtual Visits (Telemedicine).  Patients are able to view lab/test results, encounter notes, upcoming appointments, etc.  Non-urgent messages can be sent to your provider as well.   To learn more about what you can do with MyChart, go to [email or web address].    Your next appointment:   1 year(s)  The format for your next appointment:   In Person  Provider:   Mertie Moores, MD  Other Instructions    Important Information About Sugar         Signed, Mertie Moores, MD  04/21/2022 6:06 PM    [place]

## 2022-04-21 ENCOUNTER — Ambulatory Visit: Payer: Medicare HMO

## 2022-04-21 ENCOUNTER — Encounter: Payer: Self-pay | Admitting: Cardiovascular Disease

## 2022-04-21 ENCOUNTER — Ambulatory Visit: Payer: Medicare HMO | Attending: Cardiovascular Disease | Admitting: Cardiovascular Disease

## 2022-04-21 VITALS — BP 108/60 | HR 42 | Ht 68.0 in | Wt 133.0 lb

## 2022-04-21 DIAGNOSIS — R198 Other specified symptoms and signs involving the digestive system and abdomen: Secondary | ICD-10-CM

## 2022-04-21 DIAGNOSIS — I2510 Atherosclerotic heart disease of native coronary artery without angina pectoris: Secondary | ICD-10-CM | POA: Diagnosis not present

## 2022-04-21 DIAGNOSIS — R079 Chest pain, unspecified: Secondary | ICD-10-CM

## 2022-04-21 DIAGNOSIS — Z79899 Other long term (current) drug therapy: Secondary | ICD-10-CM | POA: Diagnosis not present

## 2022-04-21 DIAGNOSIS — E782 Mixed hyperlipidemia: Secondary | ICD-10-CM

## 2022-04-21 DIAGNOSIS — R0989 Other specified symptoms and signs involving the circulatory and respiratory systems: Secondary | ICD-10-CM | POA: Diagnosis not present

## 2022-04-21 LAB — BASIC METABOLIC PANEL
BUN/Creatinine Ratio: 12 (ref 12–28)
BUN: 11 mg/dL (ref 8–27)
CO2: 28 mmol/L (ref 20–29)
Calcium: 9.8 mg/dL (ref 8.7–10.3)
Chloride: 102 mmol/L (ref 96–106)
Creatinine, Ser: 0.92 mg/dL (ref 0.57–1.00)
Glucose: 71 mg/dL (ref 70–99)
Potassium: 3.8 mmol/L (ref 3.5–5.2)
Sodium: 142 mmol/L (ref 134–144)
eGFR: 69 mL/min/{1.73_m2} (ref >59)

## 2022-04-21 LAB — LIPID PANEL
Chol/HDL Ratio: 1.7 ratio (ref 0.0–4.4)
Cholesterol, Total: 157 mg/dL (ref 100–199)
HDL: 91 mg/dL (ref >39)
LDL Chol Calc (NIH): 53 mg/dL (ref 0–99)
Triglycerides: 68 mg/dL (ref 0–149)
VLDL Cholesterol Cal: 13 mg/dL (ref 5–40)

## 2022-04-21 LAB — ALT: ALT: 14 IU/L (ref 0–32)

## 2022-04-21 NOTE — Patient Instructions (Signed)
Medication Instructions:  Your physician recommends that you continue on your current medications as directed. Please refer to the Current Medication list given to you today.  *If you need a refill on your cardiac medications before your next appointment, please call your pharmacy*   Lab Work: LIPIDS, ALT, BMET today If you have labs (blood work) drawn today and your tests are completely normal, you will receive your results only by: [place] (if you have MyChart) OR A paper copy in the mail If you have any lab test that is abnormal or we need to change your treatment, we will call you to review the results.   Testing/Procedures: Abdominal Ultrasound duplex Your physician has requested that you have an abdominal aorta duplex. During this test, an ultrasound is used to evaluate the aorta. Allow 30 minutes for this exam. Do not eat after midnight the day before and avoid carbonated beverages    Follow-Up: At [facility], you and your health needs are our priority.  As part of our continuing mission to provide you with exceptional heart care, we have created designated Provider Care Teams.  These Care Teams include your primary Cardiologist (physician) and Advanced Practice Providers (APPs -  Physician Assistants and Nurse Practitioners) who all work together to provide you with the care you need, when you need it.  We recommend signing up for the patient portal called "MyChart".  Sign up information is provided on this After Visit Summary.  MyChart is used to connect with patients for Virtual Visits (Telemedicine).  Patients are able to view lab/test results, encounter notes, upcoming appointments, etc.  Non-urgent messages can be sent to your provider as well.   To learn more about what you can do with MyChart, go to [email or web address].    Your next appointment:   1 year(s)  The format for your next appointment:   In Person  Provider:   Mertie Moores, MD  Other  Instructions    Important Information About Sugar

## 2022-04-23 DIAGNOSIS — L811 Chloasma: Secondary | ICD-10-CM | POA: Diagnosis not present

## 2022-04-23 DIAGNOSIS — D229 Melanocytic nevi, unspecified: Secondary | ICD-10-CM | POA: Diagnosis not present

## 2022-04-23 DIAGNOSIS — L820 Inflamed seborrheic keratosis: Secondary | ICD-10-CM | POA: Diagnosis not present

## 2022-04-23 DIAGNOSIS — Z411 Encounter for cosmetic surgery: Secondary | ICD-10-CM | POA: Diagnosis not present

## 2022-04-23 DIAGNOSIS — L821 Other seborrheic keratosis: Secondary | ICD-10-CM | POA: Diagnosis not present

## 2022-04-23 DIAGNOSIS — L578 Other skin changes due to chronic exposure to nonionizing radiation: Secondary | ICD-10-CM | POA: Diagnosis not present

## 2022-04-23 DIAGNOSIS — L814 Other melanin hyperpigmentation: Secondary | ICD-10-CM | POA: Diagnosis not present

## 2022-04-23 DIAGNOSIS — Z8589 Personal history of malignant neoplasm of other organs and systems: Secondary | ICD-10-CM | POA: Diagnosis not present

## 2022-05-05 ENCOUNTER — Ambulatory Visit (HOSPITAL_COMMUNITY)
Admission: RE | Admit: 2022-05-05 | Discharge: 2022-05-05 | Disposition: A | Discharge status: Home / Self Care | Payer: Medicare HMO | Source: Ambulatory Visit | Attending: Cardiovascular Disease | Admitting: Cardiovascular Disease

## 2022-05-05 DIAGNOSIS — R198 Other specified symptoms and signs involving the digestive system and abdomen: Secondary | ICD-10-CM | POA: Diagnosis not present

## 2022-05-05 DIAGNOSIS — R0989 Other specified symptoms and signs involving the circulatory and respiratory systems: Secondary | ICD-10-CM | POA: Diagnosis not present

## 2022-05-05 DIAGNOSIS — I2510 Atherosclerotic heart disease of native coronary artery without angina pectoris: Secondary | ICD-10-CM | POA: Diagnosis not present

## 2022-05-05 DIAGNOSIS — E782 Mixed hyperlipidemia: Secondary | ICD-10-CM | POA: Insufficient documentation

## 2022-06-18 DIAGNOSIS — M810 Age-related osteoporosis without current pathological fracture: Secondary | ICD-10-CM | POA: Diagnosis not present

## 2022-06-18 DIAGNOSIS — E559 Vitamin D deficiency, unspecified: Secondary | ICD-10-CM | POA: Diagnosis not present

## 2022-06-18 DIAGNOSIS — F3342 Major depressive disorder, recurrent, in full remission: Secondary | ICD-10-CM | POA: Diagnosis not present

## 2022-06-18 DIAGNOSIS — R69 Illness, unspecified: Secondary | ICD-10-CM | POA: Diagnosis not present

## 2022-10-27 ENCOUNTER — Other Ambulatory Visit: Payer: Self-pay

## 2022-10-27 ENCOUNTER — Ambulatory Visit (INDEPENDENT_AMBULATORY_CARE_PROVIDER_SITE_OTHER): Payer: Medicare HMO | Admitting: Allergy and Immunology

## 2022-10-27 ENCOUNTER — Encounter: Payer: Self-pay | Admitting: Allergy and Immunology

## 2022-10-27 VITALS — BP 100/70 | HR 67 | Temp 97.3°F | Resp 16 | Ht 67.5 in | Wt 133.9 lb

## 2022-10-27 DIAGNOSIS — T7800XA Anaphylactic reaction due to unspecified food, initial encounter: Secondary | ICD-10-CM

## 2022-10-27 DIAGNOSIS — L500 Allergic urticaria: Secondary | ICD-10-CM | POA: Diagnosis not present

## 2022-10-27 DIAGNOSIS — T7840XA Allergy, unspecified, initial encounter: Secondary | ICD-10-CM | POA: Diagnosis not present

## 2022-10-27 DIAGNOSIS — R1032 Left lower quadrant pain: Secondary | ICD-10-CM | POA: Diagnosis not present

## 2022-10-27 NOTE — Patient Instructions (Addendum)
  1.  Allergen avoidance measures  2.  EpiPen, Benadryl, MD/ER evaluation for allergic reaction  3.  Blood - alpha-gal panel, tryptase   4. Further evaluation??? Yes, if with recurrent reactions

## 2022-10-27 NOTE — Progress Notes (Signed)
[place] - [place] - [place]   Dear [name],  Thank you for referring ANECIA Payne to the [facility] [place] on 10/27/2022.   Below is a summation of this patient's evaluation and recommendations.  Thank you for your referral. I will keep you informed about this patient's response to treatment.   If you have any questions please do not hesitate to contact me.   Sincerely,  Jessica Priest, MD Allergy / Immunology [place] [facility]   ______________________________________________________________________    NEW PATIENT NOTE  Referring Provider: Lorenda Ishihara,* Primary Provider: Lorenda Ishihara, MD Date of office visit: 10/27/2022    Subjective:   Chief Complaint:  Alexis Payne (DOB: 11-08-1955) is a 67 y.o. female who presents to the clinic on 10/27/2022 with a chief complaint of Allergy Testing (Food: Cheese????) .     HPI: [name] presents to this clinic in evaluation of recurrent allergic reactions.  She states that she has had about 5 allergic reactions over the course of the past several years.  Her last reaction was about 2 months ago.  She developed global urticaria, diarrhea, vomiting, and the sensation of throat closing.  She took some Benadryl and her hives lasted many hours and her stomach was somewhat upset for over a day.  There was not really an obvious provoking factor giving rise to this reaction.  It appeared at 1 AM after going out to eat at a restaurant and having Brussels sprouts, brisket, and some type of barbecue sauce and a mixed drink.  Her other reactions are sometimes solely just hives and slowly just cramping and diarrhea and she is actually passed out on 1 occasion.  She does have a history of large local reactions to flying insect venom exposure without any associated systemic or constitutional symptoms.  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  She has not had to use any eyedrops the past 3 years.  Past Medical History:  Diagnosis Date  . Reactive arthritis     Past Surgical History:  Procedure Laterality Date  . BREAST EXCISIONAL BIOPSY Right 1980   benign  . COLONOSCOPY WITH PROPOFOL N/A 08/31/2016   Procedure: COLONOSCOPY WITH PROPOFOL;  Surgeon: Charolett Bumpers, MD;  Location: [facility];  Service: Endoscopy;  Laterality: N/A;  . FRACTURE SURGERY     right shoulder  . HUMERUS SURGERY      Allergies as of 10/27/2022       Reactions   Bee Venom Swelling   Wellbutrin [bupropion] Other (See Comments)   Seizure.        Medication List    CALCIUM 600 PO Take 1 Dose by mouth daily.   EPINEPHrine 0.3 mg/0.3 mL Soaj injection Commonly known as: EPI-PEN injection IM As Directed   FLUoxetine 20 MG tablet Commonly known as: PROZAC Take 20 mg by mouth every morning.   homatropine 5 % ophthalmic solution Place 1 drop into both eyes See admin instructions. Tapering down dose up to 4 times daily for iritis as needed   prednisoLONE acetate 1 % ophthalmic suspension Commonly known as: PRED FORTE Place 1 drop into both eyes See admin instructions. Tapering dose up to 3 times daily for iritis as needed   rosuvastatin 10 MG tablet Commonly known as: CRESTOR Take  1 tablet (10 mg total) by mouth daily.   Vitamin D (Cholecalciferol) 25 MCG (1000 UT) Caps Take by mouth daily.    Review of systems negative except as noted in HPI / PMHx or noted below:  Review of Systems  Constitutional: Negative.   HENT: Negative.    Eyes: Negative.   Respiratory: Negative.    Cardiovascular: Negative.   Gastrointestinal: Negative.   Genitourinary: Negative.   Musculoskeletal: Negative.   Skin: Negative.   Neurological: Negative.   Endo/Heme/Allergies: Negative.    Psychiatric/Behavioral: Negative.      Family History  Problem Relation Age of Onset  . Pulmonary fibrosis Mother   . Heart disease Mother     Social History   Socioeconomic History  . Marital status: Married    Spouse name: Not on file  . Number of children: Not on file  . Years of education: Not on file  . Highest education level: Not on file  Occupational History  . Not on file  Tobacco Use  . Smoking status: Never    Passive exposure: Never  . Smokeless tobacco: Never  Vaping Use  . Vaping Use: Never used  Substance and Sexual Activity  . Alcohol use: Yes    Alcohol/week: 1.0 standard drink of alcohol    Types: 1 Glasses of wine per week    Comment: occ  . Drug use: Never  . Sexual activity: Yes    Birth control/protection: Post-menopausal  Other Topics Concern  . Not on file  Social History Narrative  . Not on file   Environmental and Social history  Lives in a house with a dry environment, a dog located inside the household, no carpet in the bedroom, no plastic on the bed, no plastic on the pillow, no smoking ongoing with inside the household.  Objective:   Vitals:   10/27/22 1411  BP: 100/70  Pulse: 67  Resp: 16  Temp: (!) 97.3 F (36.3 C)  SpO2: 100%   Height: 5' 7.5" (171.5 cm) Weight: 133 lb 14.4 oz (60.7 kg)  Physical Exam Constitutional:      Appearance: She is not diaphoretic.  HENT:     Head: Normocephalic.     Right Ear: Tympanic membrane, ear canal and external ear normal.     Left Ear: Tympanic membrane, ear canal and external ear normal.     Nose: Nose normal. No mucosal edema or rhinorrhea.     Mouth/Throat:     Pharynx: Uvula midline. No oropharyngeal exudate.  Eyes:     Conjunctiva/sclera: Conjunctivae normal.  Neck:     Thyroid: No thyromegaly.     Trachea: Trachea normal. No tracheal tenderness or tracheal deviation.  Cardiovascular:     Rate and Rhythm: Normal rate and regular rhythm.     Heart sounds: Normal heart  sounds, S1 normal and S2 normal. No murmur heard. Pulmonary:     Effort: No respiratory distress.     Breath sounds: Normal breath sounds. No stridor. No wheezing or rales.  Lymphadenopathy:     Head:     Right side of head: No tonsillar adenopathy.     Left side of head: No tonsillar adenopathy.     Cervical: No cervical adenopathy.  Skin:    Findings: No erythema or rash.     Nails: There is no clubbing.  Neurological:     Mental Status: She is alert.    Diagnostics: Allergy skin tests were performed.   Spirometry was performed and demonstrated  an FEV1 of *** @ *** % of predicted. FEV1/FVC = ***  The patient had an Asthma Control Test with the following results:  .    Results of blood tests obtained 27 October 2022 identifies WBC 5.6, absolute eosinophil 100, absolute lymphocyte 1600, hemoglobin 12.0, platelet 257, creatinine 0.87 MGs/DL, AST 17 U/L, ALT 14 U/L   Assessment and Plan:    1. Allergy with anaphylaxis due to food     Patient Instructions   1.  Allergen avoidance measures  2.  EpiPen, Benadryl, MD/ER evaluation for allergic reaction  3.  Blood - alpha-gal panel, tryptase   4. Further evaluation??? Yes, if with recurrent reactions   Jessica Priest, MD Allergy / Immunology [place] [facility] [place]

## 2022-10-28 ENCOUNTER — Encounter: Payer: Self-pay | Admitting: Allergy and Immunology

## 2022-11-01 LAB — ALPHA-GAL PANEL
Allergen Lamb IgE: 0.32 kU/L — AB
Beef IgE: 0.34 kU/L — AB
IgE (Immunoglobulin E), Serum: 91 IU/mL (ref 6–495)
O215-IgE Alpha-Gal: 1.29 kU/L — AB
Pork IgE: 0.22 kU/L — AB

## 2022-11-01 LAB — TRYPTASE: Tryptase: 4.4 ug/L (ref 2.2–13.2)

## 2022-11-09 ENCOUNTER — Ambulatory Visit (INDEPENDENT_AMBULATORY_CARE_PROVIDER_SITE_OTHER): Payer: Medicare HMO | Admitting: Family Medicine

## 2022-11-09 ENCOUNTER — Encounter: Payer: Self-pay | Admitting: Family Medicine

## 2022-11-09 VITALS — BP 116/72 | HR 60 | Temp 98.2°F | Resp 18

## 2022-11-09 DIAGNOSIS — Z91038 Other insect allergy status: Secondary | ICD-10-CM

## 2022-11-09 DIAGNOSIS — Z91018 Allergy to other foods: Secondary | ICD-10-CM

## 2022-11-09 MED ORDER — EPINEPHRINE 0.3 MG/0.3ML IJ SOAJ: 0.3000 mg | INTRAMUSCULAR | 2 refills | Status: AC | PRN

## 2022-11-09 NOTE — Patient Instructions (Addendum)
Alpha gal allergy Continue to avoid mammalian meats and mammalian products.   In case of an allergic reaction, take Benadryl 50 mg every 4 hours, and if life-threatening symptoms occur, inject with EpiPen 0.3 mg. We can recheck your alpha gal levels in about 1 year  Stinging insect allergy Continue to avoid insect stings.  In case of an allergic reaction, take Benadryl 50 mg every 4 hours, and if life-threatening symptoms occur, inject with EpiPen 0.3 mg. A lab order has been placed to help [place] evaluate your stinging insect allergy. We will call you when the results become available  Call the clinic if this treatment plan is not working well for you  Follow up in 6 months or sooner if needed.

## 2022-11-09 NOTE — Progress Notes (Signed)
[address] [place] [address] Dept: [phone]  FOLLOW UP NOTE  Patient ID: Alexis Payne, female    DOB: [birth year]  Age: 67 y.o. MRN: [id number] Date of Office Visit: 11/09/2022  Assessment  Chief Complaint: Allergy with anphyylaxis due to food and Follow-up  HPI Alexis Payne is a 67 year old female who presents to the clinic for a follow-up visit.  She was last seen in this clinic on 10/27/2022 by Dr. Lucie Leather as a new patient for evaluation of allergic reaction and urticaria.  At that time, her alpha gal blood test was elevated and she began to avoid mammal meat.  She is accompanied by her husband and who assists with history.  At today's visit, she reports that she has continued to avoid mammalian meats and is here today to discuss alpha gal allergy and further avoidance measures in more detail.  She does report that after consuming a certain type of cheese several years ago she did experience abdominal pain, however, she reports this seems to have resolved at this time.  She reports that her epinephrine autoinjector set is up-to-date, however, she keeps 1 pen with her and 1 pen at home.  She reports a new issue at today's visit.  She reports that she has recently been stung by a stinging insect after which she experienced large areas of facial swelling as well as shortness of breath.  She did not use her EpiPen at that time.  She has not had testing for allergy to stinging insects at this time.  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  Chart review indicates tryptase within normal limits on 10/27/2022.  Her current medications are listed in the chart.    Drug Allergies:  Allergies  Allergen Reactions   Bee Venom Swelling   Wellbutrin [Bupropion] Other (See Comments)    Seizure.    Physical Exam: BP 116/72   Pulse 60   Temp 98.2 F (36.8 C) (Temporal)   Resp 18   SpO2 100%    Physical Exam Vitals reviewed.  Constitutional:       Appearance: Normal appearance.  HENT:     Head: Normocephalic and atraumatic.     Right Ear: Tympanic membrane normal.     Left Ear: Tympanic membrane normal.     Nose:     Comments: Lateral nares normal.  Pharynx normal.  Ears normal.  Eyes normal.    Mouth/Throat:     Pharynx: Oropharynx is clear.  Eyes:     Conjunctiva/sclera: Conjunctivae normal.  Cardiovascular:     Rate and Rhythm: Normal rate and regular rhythm.     Heart sounds: Normal heart sounds. No murmur heard. Pulmonary:     Effort: Pulmonary effort is normal.     Breath sounds: Normal breath sounds.     Comments: Lungs clear to auscultation Musculoskeletal:        General: Normal range of motion.     Cervical back: Normal range of motion and neck supple.  Skin:    General: Skin is warm and dry.  Neurological:     Mental Status: She is alert and oriented to person, place, and time.  Psychiatric:        Mood and Affect: Mood normal.        Behavior: Behavior normal.        Thought Content: Thought content normal.        Judgment: Judgment normal.  Assessment and Plan: 1. 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 Allergy to alpha-gal     Meds ordered this encounter  Medications   EPINEPHrine 0.3 mg/0.3 mL IJ SOAJ injection    Sig: Inject 0.3 mg into the muscle as needed for anaphylaxis. injection IM As Directed    Dispense:  1 each    Refill:  2    Patient Instructions  Alpha gal allergy Continue to avoid mammalian meats and mammalian products.   In case of an allergic reaction, take Benadryl 50 mg every 4 hours, and if life-threatening symptoms occur, inject with EpiPen 0.3 mg. We can recheck your alpha gal levels in about 1 year  Stinging insect allergy Continue to avoid insect stings.  In case of an allergic reaction, take Benadryl 50 mg every 4 hours, and if life-threatening symptoms occur, inject with EpiPen 0.3 mg. A lab order has been placed to help Alexis Payne evaluate your stinging insect allergy. We will  call you when the results become available  Call the clinic if this treatment plan is not working well for you  Follow up in 6 months or sooner if needed.   Return in about 6 months (around 05/12/2023), or if symptoms worsen or fail to improve.    Thank you for the opportunity to care for this patient.  Please do not hesitate to contact me with questions.  Thermon Leyland, FNP [facility]

## 2022-12-02 DIAGNOSIS — R3915 Urgency of urination: Secondary | ICD-10-CM | POA: Diagnosis not present

## 2022-12-02 DIAGNOSIS — N3281 Overactive bladder: Secondary | ICD-10-CM | POA: Diagnosis not present

## 2022-12-02 DIAGNOSIS — Z01419 Encounter for gynecological examination (general) (routine) without abnormal findings: Secondary | ICD-10-CM | POA: Diagnosis not present

## 2022-12-21 DIAGNOSIS — M6281 Muscle weakness (generalized): Secondary | ICD-10-CM | POA: Diagnosis not present

## 2022-12-21 DIAGNOSIS — N3941 Urge incontinence: Secondary | ICD-10-CM | POA: Diagnosis not present

## 2022-12-21 DIAGNOSIS — R278 Other lack of coordination: Secondary | ICD-10-CM | POA: Diagnosis not present

## 2023-01-06 DIAGNOSIS — Z1331 Encounter for screening for depression: Secondary | ICD-10-CM | POA: Diagnosis not present

## 2023-01-06 DIAGNOSIS — Z0000 Encounter for general adult medical examination without abnormal findings: Secondary | ICD-10-CM | POA: Diagnosis not present

## 2023-01-06 DIAGNOSIS — Z136 Encounter for screening for cardiovascular disorders: Secondary | ICD-10-CM | POA: Diagnosis not present

## 2023-01-06 DIAGNOSIS — H9193 Unspecified hearing loss, bilateral: Secondary | ICD-10-CM | POA: Diagnosis not present

## 2023-01-06 DIAGNOSIS — E785 Hyperlipidemia, unspecified: Secondary | ICD-10-CM | POA: Diagnosis not present

## 2023-01-06 DIAGNOSIS — F3342 Major depressive disorder, recurrent, in full remission: Secondary | ICD-10-CM | POA: Diagnosis not present

## 2023-01-06 DIAGNOSIS — E559 Vitamin D deficiency, unspecified: Secondary | ICD-10-CM | POA: Diagnosis not present

## 2023-01-06 DIAGNOSIS — J439 Emphysema, unspecified: Secondary | ICD-10-CM | POA: Diagnosis not present

## 2023-01-06 DIAGNOSIS — M810 Age-related osteoporosis without current pathological fracture: Secondary | ICD-10-CM | POA: Diagnosis not present

## 2023-01-06 DIAGNOSIS — Z23 Encounter for immunization: Secondary | ICD-10-CM | POA: Diagnosis not present

## 2023-01-12 DIAGNOSIS — M6281 Muscle weakness (generalized): Secondary | ICD-10-CM | POA: Diagnosis not present

## 2023-01-12 DIAGNOSIS — R278 Other lack of coordination: Secondary | ICD-10-CM | POA: Diagnosis not present

## 2023-01-12 DIAGNOSIS — N3941 Urge incontinence: Secondary | ICD-10-CM | POA: Diagnosis not present

## 2023-01-14 DIAGNOSIS — Z0100 Encounter for examination of eyes and vision without abnormal findings: Secondary | ICD-10-CM | POA: Diagnosis not present

## 2023-01-14 DIAGNOSIS — H5213 Myopia, bilateral: Secondary | ICD-10-CM | POA: Diagnosis not present

## 2023-02-15 DIAGNOSIS — R278 Other lack of coordination: Secondary | ICD-10-CM | POA: Diagnosis not present

## 2023-02-15 DIAGNOSIS — M6281 Muscle weakness (generalized): Secondary | ICD-10-CM | POA: Diagnosis not present

## 2023-02-15 DIAGNOSIS — N3941 Urge incontinence: Secondary | ICD-10-CM | POA: Diagnosis not present

## 2023-02-17 ENCOUNTER — Emergency Department (HOSPITAL_BASED_OUTPATIENT_CLINIC_OR_DEPARTMENT_OTHER)
Admission: EM | Admit: 2023-02-17 | Discharge: 2023-02-17 | Disposition: A | Discharge status: Home / Self Care | Payer: Medicare HMO | Attending: Emergency Medicine | Admitting: Emergency Medicine

## 2023-02-17 ENCOUNTER — Other Ambulatory Visit (HOSPITAL_BASED_OUTPATIENT_CLINIC_OR_DEPARTMENT_OTHER): Payer: Self-pay

## 2023-02-17 ENCOUNTER — Other Ambulatory Visit: Payer: Self-pay

## 2023-02-17 DIAGNOSIS — T63441A Toxic effect of venom of bees, accidental (unintentional), initial encounter: Secondary | ICD-10-CM | POA: Diagnosis not present

## 2023-02-17 DIAGNOSIS — T63444A Toxic effect of venom of bees, undetermined, initial encounter: Secondary | ICD-10-CM

## 2023-02-17 MED ORDER — METHYLPREDNISOLONE SODIUM SUCC 125 MG IJ SOLR
125.0000 mg | INTRAMUSCULAR | Status: AC
  Administered 2023-02-17: 125 mg via INTRAVENOUS
  Filled 2023-02-17: qty 2

## 2023-02-17 MED ORDER — FAMOTIDINE IN NACL 20-0.9 MG/50ML-% IV SOLN
20.0000 mg | Freq: Once | INTRAVENOUS | Status: AC
  Administered 2023-02-17: 20 mg via INTRAVENOUS
  Filled 2023-02-17: qty 50

## 2023-02-17 NOTE — ED Provider Notes (Signed)
[place] EMERGENCY DEPARTMENT AT [facility] Provider Note   CSN: [id number] Arrival date & time: 02/17/23  1056     History  Chief Complaint  Patient presents with   Insect Bite   Allergic Reaction    Alexis Payne is a 67 y.o. female.  67 year old female with a history of alpha gal who presents to the emergency department due to concerns for allergic reaction.  Patient reports that she was told by her allergist that she has a bee allergy.  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  Says that her nose is swollen and so she took 2 Benadryl immediately.  Got her EpiPen out but did not administer it to herself.  Says that she was stung another time multiple times in the face by bees and had only swelling.  Based on notes from her most recent allergy visit they sent a hymenoptera panel which is pending at this time.  She denies any chest pain, shortness of breath, rash, nausea or vomiting or dizziness.       Home Medications Prior to Admission medications   Medication Sig Start Date End Date Taking? Authorizing Provider  Calcium Carbonate (CALCIUM 600 PO) Take 1 Dose by mouth daily.   Yes [provider]  FLUoxetine (PROZAC) 20 MG tablet Take 20 mg by mouth every morning.   Yes [provider]  rosuvastatin (CRESTOR) 10 MG tablet Take 1 tablet (10 mg total) by mouth daily. 02/11/22  Yes Nahser, Deloris Ping, MD  Vitamin D, Cholecalciferol, 1000 units CAPS Take by mouth daily.   Yes [provider]  EPINEPHrine 0.3 mg/0.3 mL IJ SOAJ injection Inject 0.3 mg into the muscle as needed for anaphylaxis. injection IM As Directed 11/09/22   Hetty Blend, FNP      Allergies    Alpha-gal, Hymenoptera venom preparations, Bee venom, and Wellbutrin [bupropion]    Review of Systems   Review of Systems  Physical Exam Updated Vital Signs BP 136/73   Pulse (!) 52   Temp 98 F (36.7 C)   Resp 16   Ht 5' 7.5" (1.715 m)   Wt 60.7 kg   SpO2 100%    BMI 20.65 kg/m  Physical Exam Vitals and nursing note reviewed.  Constitutional:      General: She is not in acute distress.    Appearance: She is well-developed.  HENT:     Head: Normocephalic and atraumatic.     Right Ear: External ear normal.     Left Ear: External ear normal.     Nose: Nose normal.  Eyes:     Extraocular Movements: Extraocular movements intact.     Conjunctiva/sclera: Conjunctivae normal.     Pupils: Pupils are equal, round, and reactive to light.  Cardiovascular:     Rate and Rhythm: Normal rate and regular rhythm.     Heart sounds: No murmur heard. Pulmonary:     Effort: Pulmonary effort is normal. No respiratory distress.     Breath sounds: Normal breath sounds.  Musculoskeletal:     Cervical back: Normal range of motion and neck supple.     Right lower leg: No edema.     Left lower leg: No edema.  Skin:    General: Skin is warm and dry.     Findings: No rash.     Comments: No rash on chest, back, abdomen, arms, or face  Neurological:     Mental Status: She is alert  and oriented to person, place, and time. Mental status is at baseline.  Psychiatric:        Mood and Affect: Mood normal.     ED Results / Procedures / Treatments   Labs (all labs ordered are listed, but only abnormal results are displayed) Labs Reviewed - No data to display  EKG None  Radiology No results found.  Procedures Procedures    Medications Ordered in ED Medications  methylPREDNISolone sodium succinate (SOLU-MEDROL) 125 mg/2 mL injection 125 mg (125 mg Intravenous Given 02/17/23 1120)  famotidine (PEPCID) IVPB 20 mg premix (0 mg Intravenous Stopped 02/17/23 1153)    ED Course/ Medical Decision Making/ A&P                                 Medical Decision Making Risk Prescription drug management.   Alexis Payne is a 67 y.o. female with comorbidities that complicate the patient evaluation including alpha gal who presents to the emergency department due to  concerns for an allergic reaction after a bee sting  Initial Ddx:  Localized reaction, allergic reaction, anaphylaxis  MDM/Course:  Patient presents to the emergency department with swelling on her nose after being stung by bee.  She was initially under the impression that she had a bee allergy after a recent outpatient allergy appointment and allergy testing.  Not currently having any signs of anaphylaxis.  Did give herself some Benadryl before coming in.  Was given Solu-Medrol and Pepcid initially on arrival to the emergency department.  Upon chart review appears that she has not actually been diagnosed with a hymenoptera allergy since her lab testing is still pending.  I did contact her outpatient allergist and they confirmed that this has not been formally diagnosed yet at this time.  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  We did attempt to send the lab work but unfortunately it is a send out test and we are unable to have [facility] coordinate with our lab what samples needed to be sent over.  Will have her follow-up with her outpatient allergist about this.  Upon re-evaluation after several hours in the emergency department did not develop any signs of anaphylaxis.  Was counseled on how to use the autoinjector and when to use it if she should need to.  Was updated regarding the status of her bee allergy.  This patient presents to the ED for concern of complaints listed in HPI, this involves an extensive number of treatment options, and is a complaint that carries with it a high risk of complications and morbidity. Disposition including potential need for admission considered.   Dispo: DC Home. Return precautions discussed including, but not limited to, those listed in the AVS. Allowed pt time to ask questions which were answered fully prior to dc.  Additional history obtained from family Records reviewed Outpatient Clinic Notes I have reviewed the  patients home medications and made adjustments as needed Consults:  Allergy Social Determinants of health:  Elderly         Final Clinical Impression(s) / ED Diagnoses Final diagnoses:  Bee sting reaction, undetermined intent, initial encounter    Rx / DC Orders ED Discharge Orders     None         Rondel Baton, MD 02/17/23 [phone]

## 2023-02-17 NOTE — ED Triage Notes (Signed)
Pt via pov from home after a bee sting. 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 Pt has epi pen, but was unable to use it. She needs education on how to use it. Pt took 2 benadryl after the sting. States she has some swelling and congestion in her nose, some itchiness in her throat. Pt denies any sob. Pt alert & oriented, nad noted.

## 2023-02-17 NOTE — Discharge Instructions (Signed)
Follow-up with your allergist about your bee allergy.  Use your EpiPen if you have difficulty breathing after being exposed to an allergen.  Return to the emergency department if you use your EpiPen or have any other concerns.

## 2023-03-04 DIAGNOSIS — F419 Anxiety disorder, unspecified: Secondary | ICD-10-CM | POA: Diagnosis not present

## 2023-03-04 DIAGNOSIS — Z85828 Personal history of other malignant neoplasm of skin: Secondary | ICD-10-CM | POA: Diagnosis not present

## 2023-03-04 DIAGNOSIS — Z87892 Personal history of anaphylaxis: Secondary | ICD-10-CM | POA: Diagnosis not present

## 2023-03-04 DIAGNOSIS — F325 Major depressive disorder, single episode, in full remission: Secondary | ICD-10-CM | POA: Diagnosis not present

## 2023-03-04 DIAGNOSIS — N3941 Urge incontinence: Secondary | ICD-10-CM | POA: Diagnosis not present

## 2023-03-04 DIAGNOSIS — M810 Age-related osteoporosis without current pathological fracture: Secondary | ICD-10-CM | POA: Diagnosis not present

## 2023-03-04 DIAGNOSIS — Z8249 Family history of ischemic heart disease and other diseases of the circulatory system: Secondary | ICD-10-CM | POA: Diagnosis not present

## 2023-03-04 DIAGNOSIS — Z888 Allergy status to other drugs, medicaments and biological substances status: Secondary | ICD-10-CM | POA: Diagnosis not present

## 2023-03-04 DIAGNOSIS — E785 Hyperlipidemia, unspecified: Secondary | ICD-10-CM | POA: Diagnosis not present

## 2023-03-09 DIAGNOSIS — M6281 Muscle weakness (generalized): Secondary | ICD-10-CM | POA: Diagnosis not present

## 2023-03-09 DIAGNOSIS — N3941 Urge incontinence: Secondary | ICD-10-CM | POA: Diagnosis not present

## 2023-03-09 DIAGNOSIS — R278 Other lack of coordination: Secondary | ICD-10-CM | POA: Diagnosis not present

## 2023-04-28 DIAGNOSIS — Z8589 Personal history of malignant neoplasm of other organs and systems: Secondary | ICD-10-CM | POA: Diagnosis not present

## 2023-04-28 DIAGNOSIS — L578 Other skin changes due to chronic exposure to nonionizing radiation: Secondary | ICD-10-CM | POA: Diagnosis not present

## 2023-04-28 DIAGNOSIS — L821 Other seborrheic keratosis: Secondary | ICD-10-CM | POA: Diagnosis not present

## 2023-04-28 DIAGNOSIS — D229 Melanocytic nevi, unspecified: Secondary | ICD-10-CM | POA: Diagnosis not present

## 2023-04-28 DIAGNOSIS — L814 Other melanin hyperpigmentation: Secondary | ICD-10-CM | POA: Diagnosis not present

## 2023-04-28 DIAGNOSIS — Z411 Encounter for cosmetic surgery: Secondary | ICD-10-CM | POA: Diagnosis not present

## 2023-04-29 DIAGNOSIS — H53143 Visual discomfort, bilateral: Secondary | ICD-10-CM | POA: Diagnosis not present

## 2023-06-07 ENCOUNTER — Encounter: Payer: Self-pay | Admitting: Cardiovascular Disease

## 2023-06-07 NOTE — Progress Notes (Unsigned)
Cardiology Office Note:    Date:  06/08/2023   ID:  Alexis Payne, DOB [birth year], MRN [id number]  PCP:  Lorenda Ishihara, MD  Cardiologist:  Tymeka Privette  Electrophysiologist:  None   Referring MD: Lorenda Ishihara,*   Chief Complaint  Patient presents with   Hyperlipidemia    History of Present Illness: [name], 2020   Alexis Payne is a 67 y.o. female with a hx of arthritis who presents for further evaluation of some chest pain   Has burning in her chest for the past month Not related to change of position or , no pleuretic,  Not related to twisting torso She walks several times s a week - 1 hours 3 times a week .  Not related to arm movement .  Has arthritis - get injection ,  Tablets gave her flu like symptoms   Has occasional abdominal pain ,   She has tried pepcid  Abdominal US showed diverticuli Up to date with her colonoscopies.    Has gall stones  Mother had a pacer. Grandmother had a MI   January 20, 2022 Alexis Payne is seen today for follow up of her chest pain She was seen by her primary MD , ECG shows poor R progression   Went to the ER at [facility]  Was found to have costochondritis  Complains of pain along her Left breast  Is reproducible   Wearing a bra causes it to hurt worse Might last for several minutes But has lasted as long as 2 hours   Has not been lifting anything heavy, nothing out of the ordinary    CP for 2 weeks  Also has had diarrhea for 2 weeks Has LUQ pain off and on for many months No blood in her stool  Has not tried Immodium  Tried pepto bismol  - no relief    Fam. Hx :   + Palpitations in her father . Alexis Payne ( mothers father ) had an MI   Oct. 17, 2023 Alexis Payne is seen for further evaluation of her CP, family hx of CAD , HLD   Coronary CT angiogram revealed coronary calcium score of 0.  She has minimal coronary artery disease involving the proximal RCA.  The other vessels have no significant coronary artery  disease.  We started Rosuvastatin 10 mg at her last visit   Dec. 3, 2024 Alexis Payne is seen for follw up of her HLD, family hx of CAD Her LDL from Oct. 2023 is 40  Labs on the [facility] reveal lipid levels from January 06, 2023 Total cholesterol is 164 HDL is 85 LDL is 65 Triglyceride level is 68  She is doing well  Is walking some      Past Medical History:  Diagnosis Date   Reactive arthritis (HCC)     Past Surgical History:  Procedure Laterality Date   BREAST EXCISIONAL BIOPSY Right 1980   benign   COLONOSCOPY WITH PROPOFOL N/A 08/31/2016   Procedure: COLONOSCOPY WITH PROPOFOL;  Surgeon: Charolett Bumpers, MD;  Location: [facility];  Service: Endoscopy;  Laterality: N/A;   FRACTURE SURGERY     right shoulder   HUMERUS SURGERY      Current Medications: Current Meds  Medication Sig   Calcium Carbonate (CALCIUM 600 PO) Take 1 Dose by mouth daily.   EPINEPHrine 0.3 mg/0.3 mL IJ SOAJ injection Inject 0.3 mg into the muscle as needed for anaphylaxis. injection IM As Directed   FLUoxetine (PROZAC) 20 MG tablet Take 20  mg by mouth every morning.   rosuvastatin (CRESTOR) 10 MG tablet Take 1 tablet (10 mg total) by mouth daily.   Vitamin D, Cholecalciferol, 1000 units CAPS Take by mouth daily.     Allergies:   Alpha-gal, Hymenoptera venom preparations, Bee venom, and Wellbutrin [bupropion]   Social History   Socioeconomic History   Marital status: Married    Spouse name: Not on file   Number of children: Not on file   Years of education: Not on file   Highest education level: Not on file  Occupational History   Not on file  Tobacco Use   Smoking status: Never    Passive exposure: Never   Smokeless tobacco: Never  Vaping Use   Vaping status: Never Used  Substance and Sexual Activity   Alcohol use: Yes    Alcohol/week: 1.0 standard drink of alcohol    Types: 1 Glasses of wine per week    Comment: occ   Drug use: Never   Sexual activity: Yes    Birth control/protection:  Post-menopausal  Other Topics Concern   Not on file  Social History Narrative   Not on file   Social Determinants of Health   Financial Resource Strain: Not on file  Food Insecurity: Not on file  Transportation Needs: Not on file  Physical Activity: Not on file  Stress: Not on file  Social Connections: Not on file     Family History: The patient's family history includes Heart disease in her mother; Pulmonary fibrosis in her mother.  ROS:   Please see the history of present illness.     All other systems reviewed and are negative.  EKGs/Labs/Other Studies Reviewed:    The following studies were reviewed today:   EKG:    EKG Interpretation Date/Time:  Tuesday June 08 2023 14:13:46 EST Ventricular Rate:  57 PR Interval:  182 QRS Duration:  72 QT Interval:  422 QTC Calculation: 410 R Axis:   96  Text Interpretation: Sinus bradycardia Rightward axis Low voltage QRS When compared with ECG of 13-[name]-2022 16:16, Questionable change in QRS axis Confirmed by Kristeen Miss ([id number]) on 06/08/2023 2:28:26 PM      Recent Labs: No results found for requested labs within last 365 days.  Recent Lipid Panel    Component Value Date/Time   CHOL 157 04/21/2022 1058   TRIG 68 04/21/2022 1058   HDL 91 04/21/2022 1058   CHOLHDL 1.7 04/21/2022 1058   LDLCALC 53 04/21/2022 1058    Physical Exam:    Physical Exam: Blood pressure 112/60, pulse (!) 59, height 5' 7.5" (1.715 m), weight 130 lb 12.8 oz (59.3 kg), SpO2 99%.       GEN:  Well nourished, well developed in no acute distress HEENT: Normal NECK: No JVD; No carotid bruits LYMPHATICS: No lymphadenopathy CARDIAC: RRR , no murmurs, rubs, gallops RESPIRATORY:  Clear to auscultation without rales, wheezing or rhonchi  ABDOMEN: Soft, non-tender, non-distended MUSCULOSKELETAL:  No edema; No deformity  SKIN: Warm and dry NEUROLOGIC:  Alert and oriented x 3    ASSESSMENT:    1. Palpable abdominal aorta   2. Mixed  hyperlipidemia       PLAN:      Chest discomfort/chest tightness: Coronary CT angiogram reveals a coronary calcium score of 0.  She has minimal coronary artery disease in the proximal right coronary artery.  At this point I do not think that her chest pain is due to a cardiac etiology. Marland Kitchen  2.  Mild hyperlipidemia:  lipids look great .  Cont rosuvastatin    3.  Pulsatile abdominal aorta:   benign        Medication Adjustments/Labs and Tests Ordered: Current medicines are reviewed at length with the patient today.  Concerns regarding medicines are outlined above.  Orders Placed This Encounter  Procedures   EKG 12-Lead   No orders of the defined types were placed in this encounter.    Patient Instructions  Follow-Up: At [facility] you and your health needs are our priority.  As part of our continuing mission to provide you with exceptional heart care, we have created designated Provider Care Teams.  These Care Teams include your primary Cardiologist (physician) and Advanced Practice Providers (APPs -  Physician Assistants and Nurse Practitioners) who all work together to provide you with the care you need, when you need it.  Your next appointment:   1 year(s)  Provider:   Jacques Navy, MD       Signed, Kristeen Miss, MD  06/08/2023 2:41 PM    [facility]

## 2023-06-08 ENCOUNTER — Ambulatory Visit: Payer: Medicare HMO | Attending: Cardiovascular Disease | Admitting: Cardiovascular Disease

## 2023-06-08 VITALS — BP 112/60 | HR 59 | Ht 67.5 in | Wt 130.8 lb

## 2023-06-08 DIAGNOSIS — E782 Mixed hyperlipidemia: Secondary | ICD-10-CM | POA: Diagnosis not present

## 2023-06-08 DIAGNOSIS — R0989 Other specified symptoms and signs involving the circulatory and respiratory systems: Secondary | ICD-10-CM

## 2023-06-08 NOTE — Patient Instructions (Signed)
Follow-Up: At [facility], you and your health needs are our priority.  As part of our continuing mission to provide you with exceptional heart care, we have created designated Provider Care Teams.  These Care Teams include your primary Cardiologist (physician) and Advanced Practice Providers (APPs -  Physician Assistants and Nurse Practitioners) who all work together to provide you with the care you need, when you need it.  Your next appointment:   1 year(s)  Provider:   Jacques Navy, MD

## 2023-08-11 DIAGNOSIS — F3342 Major depressive disorder, recurrent, in full remission: Secondary | ICD-10-CM | POA: Diagnosis not present

## 2023-09-09 ENCOUNTER — Other Ambulatory Visit: Payer: Self-pay | Admitting: Internal Medicine

## 2023-09-09 DIAGNOSIS — Z1231 Encounter for screening mammogram for malignant neoplasm of breast: Secondary | ICD-10-CM

## 2023-09-14 DIAGNOSIS — M25512 Pain in left shoulder: Secondary | ICD-10-CM | POA: Diagnosis not present

## 2023-09-15 DIAGNOSIS — M25512 Pain in left shoulder: Secondary | ICD-10-CM | POA: Diagnosis not present

## 2023-09-21 ENCOUNTER — Ambulatory Visit

## 2023-09-21 ENCOUNTER — Ambulatory Visit
Admission: RE | Admit: 2023-09-21 | Discharge: 2023-09-21 | Disposition: A | Discharge status: Home / Self Care | Source: Ambulatory Visit | Attending: Internal Medicine | Admitting: Internal Medicine

## 2023-09-21 DIAGNOSIS — Z1231 Encounter for screening mammogram for malignant neoplasm of breast: Secondary | ICD-10-CM

## 2023-11-02 DIAGNOSIS — Z008 Encounter for other general examination: Secondary | ICD-10-CM | POA: Diagnosis not present

## 2023-12-09 DIAGNOSIS — Z124 Encounter for screening for malignant neoplasm of cervix: Secondary | ICD-10-CM | POA: Diagnosis not present

## 2023-12-27 DIAGNOSIS — F3342 Major depressive disorder, recurrent, in full remission: Secondary | ICD-10-CM | POA: Diagnosis not present

## 2023-12-27 DIAGNOSIS — L0390 Cellulitis, unspecified: Secondary | ICD-10-CM | POA: Diagnosis not present

## 2023-12-27 DIAGNOSIS — W57XXXA Bitten or stung by nonvenomous insect and other nonvenomous arthropods, initial encounter: Secondary | ICD-10-CM | POA: Diagnosis not present

## 2024-01-17 DIAGNOSIS — H52213 Irregular astigmatism, bilateral: Secondary | ICD-10-CM | POA: Diagnosis not present

## 2024-02-01 DIAGNOSIS — Z8262 Family history of osteoporosis: Secondary | ICD-10-CM | POA: Diagnosis not present

## 2024-02-01 DIAGNOSIS — M8588 Other specified disorders of bone density and structure, other site: Secondary | ICD-10-CM | POA: Diagnosis not present

## 2024-02-23 DIAGNOSIS — Z0100 Encounter for examination of eyes and vision without abnormal findings: Secondary | ICD-10-CM | POA: Diagnosis not present

## 2024-05-01 DIAGNOSIS — D229 Melanocytic nevi, unspecified: Secondary | ICD-10-CM | POA: Diagnosis not present

## 2024-05-01 DIAGNOSIS — L578 Other skin changes due to chronic exposure to nonionizing radiation: Secondary | ICD-10-CM | POA: Diagnosis not present

## 2024-05-01 DIAGNOSIS — Z411 Encounter for cosmetic surgery: Secondary | ICD-10-CM | POA: Diagnosis not present

## 2024-05-01 DIAGNOSIS — L814 Other melanin hyperpigmentation: Secondary | ICD-10-CM | POA: Diagnosis not present

## 2024-05-01 DIAGNOSIS — Z8589 Personal history of malignant neoplasm of other organs and systems: Secondary | ICD-10-CM | POA: Diagnosis not present

## 2024-05-01 DIAGNOSIS — L821 Other seborrheic keratosis: Secondary | ICD-10-CM | POA: Diagnosis not present

## 2024-05-01 DIAGNOSIS — L570 Actinic keratosis: Secondary | ICD-10-CM | POA: Diagnosis not present

## 2024-05-25 DIAGNOSIS — N952 Postmenopausal atrophic vaginitis: Secondary | ICD-10-CM | POA: Diagnosis not present

## 2024-05-25 DIAGNOSIS — S30814A Abrasion of vagina and vulva, initial encounter: Secondary | ICD-10-CM | POA: Diagnosis not present

## 2024-06-16 DIAGNOSIS — F429 Obsessive-compulsive disorder, unspecified: Secondary | ICD-10-CM | POA: Diagnosis not present

## 2024-06-16 DIAGNOSIS — F50024 Anorexia nervosa, binge eating/purging type, in remission: Secondary | ICD-10-CM | POA: Diagnosis not present

## 2024-06-16 DIAGNOSIS — F331 Major depressive disorder, recurrent, moderate: Secondary | ICD-10-CM | POA: Diagnosis not present

## 2024-06-16 DIAGNOSIS — Z5181 Encounter for therapeutic drug level monitoring: Secondary | ICD-10-CM | POA: Diagnosis not present
